# Patient Record
Sex: Male | Born: 1985 | Hispanic: Yes | Marital: Married | Smoking: Never smoker
Health system: Southern US, Community
[De-identification: ages and names within clinical notes are randomized; demographics above are authoritative.]

## PROBLEM LIST (undated history)

## (undated) DIAGNOSIS — R945 Abnormal results of liver function studies: Secondary | ICD-10-CM

## (undated) DIAGNOSIS — K529 Noninfective gastroenteritis and colitis, unspecified: Secondary | ICD-10-CM

## (undated) DIAGNOSIS — R1013 Epigastric pain: Secondary | ICD-10-CM

## (undated) DIAGNOSIS — K760 Fatty (change of) liver, not elsewhere classified: Secondary | ICD-10-CM

## (undated) DIAGNOSIS — R110 Nausea: Secondary | ICD-10-CM

## (undated) DIAGNOSIS — R7989 Other specified abnormal findings of blood chemistry: Secondary | ICD-10-CM

## (undated) DIAGNOSIS — K3184 Gastroparesis: Secondary | ICD-10-CM

## (undated) DIAGNOSIS — T7840XA Allergy, unspecified, initial encounter: Secondary | ICD-10-CM

## (undated) DIAGNOSIS — G8929 Other chronic pain: Secondary | ICD-10-CM

## (undated) DIAGNOSIS — A048 Other specified bacterial intestinal infections: Secondary | ICD-10-CM

## (undated) HISTORY — DX: Gastroparesis: K31.84

## (undated) HISTORY — PX: NASAL SINUS SURGERY: SHX719

## (undated) HISTORY — DX: Other specified bacterial intestinal infections: A04.8

## (undated) HISTORY — DX: Allergy, unspecified, initial encounter: T78.40XA

---

## 2013-01-17 ENCOUNTER — Encounter (HOSPITAL_COMMUNITY): Payer: Self-pay

## 2013-01-17 ENCOUNTER — Emergency Department (HOSPITAL_COMMUNITY)
Admission: EM | Admit: 2013-01-17 | Discharge: 2013-01-17 | Disposition: A | Discharge status: Home / Self Care | Payer: Self-pay | Source: Home / Self Care | Attending: Family Medicine | Admitting: Family Medicine

## 2013-01-17 DIAGNOSIS — K047 Periapical abscess without sinus: Secondary | ICD-10-CM

## 2013-01-17 DIAGNOSIS — K029 Dental caries, unspecified: Secondary | ICD-10-CM

## 2013-01-17 DIAGNOSIS — K089 Disorder of teeth and supporting structures, unspecified: Secondary | ICD-10-CM

## 2013-01-17 DIAGNOSIS — Q181 Preauricular sinus and cyst: Secondary | ICD-10-CM

## 2013-01-17 DIAGNOSIS — K0889 Other specified disorders of teeth and supporting structures: Secondary | ICD-10-CM

## 2013-01-17 MED ORDER — HYDROCODONE-ACETAMINOPHEN 5-325 MG PO TABS
1.0000 | ORAL_TABLET | Freq: Four times a day (QID) | ORAL | Status: DC | PRN
Start: 2013-01-17 — End: 2013-10-26

## 2013-01-17 MED ORDER — DOXYCYCLINE HYCLATE 100 MG PO TABS: 100.0000 mg | ORAL_TABLET | Freq: Two times a day (BID) | ORAL | Status: DC

## 2013-01-17 NOTE — ED Provider Notes (Signed)
History     CSN: [id number]  Arrival date & time 01/17/13  1226   First MD Initiated Contact with Patient 01/17/13 1228      Chief Complaint  Patient presents with  . Dental Pain    (Consider location/radiation/quality/duration/timing/severity/associated sxs/prior treatment) HPI Patient reports that he is continuing to have pain in the mouth.  He has an area in one of his molars where it has been very painful.  He reports that eating has been very painful for him.  He reports that he is having significant pain.  He reports that he would like to have a dentist.  He has immigrated to this country from [place].  He reports that he has had poor dental care for most of his life.  The patient reports that he's had a lot of dental problems in the past.  He would like to have a referral for a dentist to see him here.  The patient denies fever chills nausea vomiting diarrhea and rash.  History reviewed. No pertinent past medical history.  History reviewed. No pertinent past surgical history.  No family history on file.  History  Substance Use Topics  . Smoking status: Not on file  . Smokeless tobacco: Not on file  . Alcohol Use: Not on file    Review of Systems  HENT: Positive for dental problem.        Significant pain in the mouth and teeth  All other systems reviewed and are negative.    Allergies  Penicillins  Home Medications  No current outpatient prescriptions on file.  BP 152/87  Pulse 89  Temp 97.9 F (36.6 C) (Oral)  Resp 18  SpO2 100%  Physical Exam  Nursing note and vitals reviewed. Constitutional: He is oriented to person, place, and time. He appears well-developed and well-nourished. No distress.  HENT:  Head: Normocephalic and atraumatic.  Left Ear: External ear normal.  Nose: Nose normal.  Mouth/Throat: He does not have dentures. No oral lesions. Abnormal dentition. Dental abscesses and dental caries present. No uvula swelling or lacerations. No  oropharyngeal exudate, posterior oropharyngeal edema or tonsillar abscesses.         Patient has a cyst that is movable and smooth on the external right ear approximately 4 cm in diameter  Eyes: Conjunctivae normal and EOM are normal. Pupils are equal, round, and reactive to light.  Neck: Normal range of motion. Neck supple.  Cardiovascular: Normal rate, regular rhythm and normal heart sounds.   Pulmonary/Chest: Effort normal.  Abdominal: Soft.  Musculoskeletal: Normal range of motion.  Neurological: He is alert and oriented to person, place, and time.  Skin: Skin is warm and dry.  Psychiatric: He has a normal mood and affect. His behavior is normal. Thought content normal.    ED Course  Procedures (including critical care time)  Labs Reviewed - No data to display No results found.  No diagnosis found.  MDM  IMPRESSION  Dental Pain  Dental Abscess  Cyst right ear   Dental caries  RECOMMENDATIONS / PLAN Doxycycline 100 mg po BID Hydrocodone APAP 5/325 - take 1 po every 6 hours prn pain Dental Referral made today to [facility] In terms of the cyst on the right ear, the patient reports that is not causing any significant symptoms.  I told him that if he develops any changes in the cyst in terms of it getting larger or painful then I can send him to an ENT specialist surgeon to  have it removed.  The patient verbalized understanding.  FOLLOW UP 1 month   The patient was given clear instructions to go to ER or return to medical center if symptoms don't improve, worsen or new problems develop.  The patient verbalized understanding.  The patient was told to call to get lab results if they haven't heard anything in the next week.            Cleora Fleet, MD 01/17/13 1513

## 2013-01-17 NOTE — ED Notes (Signed)
Complain of pain to bottom left side of mouth for past 3 days

## 2013-02-14 ENCOUNTER — Emergency Department (INDEPENDENT_AMBULATORY_CARE_PROVIDER_SITE_OTHER)
Admission: EM | Admit: 2013-02-14 | Discharge: 2013-02-14 | Disposition: A | Discharge status: Home / Self Care | Payer: Self-pay | Source: Home / Self Care

## 2013-02-14 ENCOUNTER — Encounter (HOSPITAL_COMMUNITY): Payer: Self-pay

## 2013-02-14 DIAGNOSIS — K047 Periapical abscess without sinus: Secondary | ICD-10-CM

## 2013-02-14 MED ORDER — CLINDAMYCIN HCL 300 MG PO CAPS: 300.0000 mg | ORAL_CAPSULE | Freq: Four times a day (QID) | ORAL | Status: AC

## 2013-02-14 MED ORDER — ACETAMINOPHEN-CODEINE 300-60 MG PO TABS: 1.0000 | ORAL_TABLET | ORAL | Status: DC | PRN

## 2013-02-14 NOTE — ED Notes (Signed)
Patient complains of pain in tooth on left side Referral was faxed to [facility]  Waiting on an appt.

## 2013-02-14 NOTE — ED Provider Notes (Signed)
History     CSN: [id number]  Arrival date & time 02/14/13  5419  27 year old presents with continuing tooth pain. He was seen by Dr. Laural Benes on 01/17/13 and was prescribed doxycycline as well as Vicodin for pain he was supposed to followup with a dentist and has been waiting on his appointment. He denies any fever denies any facial pain however he does describe some pain which is worse at night. He has been using a mouthwash. He was provided a referral to see a dentist during his last appointment.    Chief Complaint  Patient presents with  . Dental Pain    (Consider location/radiation/quality/duration/timing/severity/associated sxs/prior treatment) HPI  History reviewed. No pertinent past medical history.  History reviewed. No pertinent past surgical history.  No family history on file.  History  Substance Use Topics  . Smoking status: Not on file  . Smokeless tobacco: Not on file  . Alcohol Use: Not on file      Review of Systems HENT: Positive for dental problem.  Significant pain in the mouth and teeth   Allergies  Penicillins  Home Medications   Current Outpatient Rx  Name  Route  Sig  Dispense  Refill  . acetaminophen-codeine (TYLENOL/CODEINE #4) 300-60 MG per tablet   Oral   Take 1 tablet by mouth every 4 (four) hours as needed for pain.   15 tablet   0   . clindamycin (CLEOCIN) 300 MG capsule   Oral   Take 1 capsule (300 mg total) by mouth 4 (four) times daily.   28 capsule   0   . doxycycline (VIBRA-TABS) 100 MG tablet   Oral   Take 1 tablet (100 mg total) by mouth 2 (two) times daily. Take with food.  Avoid sun exposure   20 tablet   0   . HYDROcodone-acetaminophen (NORCO/VICODIN) 5-325 MG per tablet   Oral   Take 1 tablet by mouth every 6 (six) hours as needed for pain.   30 tablet   0     Temp(Src) 98.2 F (36.8 C) (Oral)  Physical Exam Nursing note and vitals reviewed.  Constitutional: He is oriented to person, place, and time. He  appears well-developed and well-nourished. No distress.  HENT:  Head: Normocephalic and atraumatic.  Left Ear: External ear normal.  Nose: Nose normal.  Mouth/Throat: He does not have dentures. No oral lesions. Abnormal dentition. Dental abscesses and dental caries present. No uvula swelling or lacerations. No oropharyngeal exudate, posterior oropharyngeal edema or tonsillar abscesses.    Patient has a cyst that is movable and smooth on the external right ear approximately 4 cm in diameter  Eyes: Conjunctivae normal and EOM are normal. Pupils are equal, round, and reactive to light.  Neck: Normal range of motion. Neck supple.  Cardiovascular: Normal rate, regular rhythm and normal heart sounds.  Pulmonary/Chest: Effort normal.  Abdominal: Soft.  Musculoskeletal: Normal range of motion.  Neurological: He is alert and oriented to person, place, and time.  Skin: Skin is warm and dry.  Psychiatric: He has a normal mood and affect. His behavior is normal. Thought content normal.   ED Course  Procedures (including critical care time)  Labs Reviewed - No data to display No results found.   1. Dental abscess       MDM  Dental Pain  Dental Abscess  Cyst right ear  Dental caries    plan #1 patient has been provided with a prescription for clindamycin for another 7 days #2  he has been provided with a prescription for Tylenol with Codeine #3 dental referral was made on 02/04 at [facility] patient still waiting on appointment  Followup when necessary, return to ER if the pain worsens this was explained to the patient and he verbalized understanding      Richarda Overlie, MD 02/14/13 1557

## 2013-08-23 ENCOUNTER — Emergency Department (INDEPENDENT_AMBULATORY_CARE_PROVIDER_SITE_OTHER)
Admission: EM | Admit: 2013-08-23 | Discharge: 2013-08-23 | Disposition: A | Discharge status: Home / Self Care | Payer: Self-pay | Source: Home / Self Care | Attending: Emergency Medicine | Admitting: Emergency Medicine

## 2013-08-23 ENCOUNTER — Encounter (HOSPITAL_COMMUNITY): Payer: Self-pay | Admitting: *Deleted

## 2013-08-23 DIAGNOSIS — L723 Sebaceous cyst: Secondary | ICD-10-CM

## 2013-08-23 DIAGNOSIS — L089 Local infection of the skin and subcutaneous tissue, unspecified: Secondary | ICD-10-CM

## 2013-08-23 MED ORDER — HYDROCODONE-ACETAMINOPHEN 5-325 MG PO TABS: ORAL_TABLET | ORAL | Status: DC

## 2013-08-23 MED ORDER — DOXYCYCLINE HYCLATE 100 MG PO TABS: 100.0000 mg | ORAL_TABLET | Freq: Two times a day (BID) | ORAL | Status: DC

## 2013-08-23 NOTE — ED Notes (Signed)
Pt  Has  A  Large  Tender  Red  Mass  On r  Side  Face below  The  Earlobe    -  He  Has  Had  The  abcess  For a  While  But  It is  Worse  Lately

## 2013-08-23 NOTE — ED Provider Notes (Signed)
Chief Complaint:   Chief Complaint  Patient presents with  . Recurrent Skin Infections    History of Present Illness:    [name] Christensen is a 27 year old [place] male who has had a one-week history of a swollen, tender abscess just in front of his right ear. The patient says he's had a small cyst in that area for years. It's not draining any pus and he has not had a fever.  Review of Systems:  Other than noted above, the patient denies any of the following symptoms: Systemic:  No fever, chills or sweats. Skin:  No rash or itching.  PMFSH:  Past medical history, family history, social history, meds, and allergies were reviewed.  No history of diabetes or prior history of abscesses or MRSA.   Physical Exam:   Vital signs:  BP 132/66  Pulse 52  Temp(Src) 98 F (36.7 C) (Oral)  Resp 16  SpO2 100% Skin:  There was a large fluctuant mass just in front of his right ear without any drainage.  Skin exam was otherwise normal.  No rash. Ext:  Distal pulses were full, patient has full ROM of all joints.  Procedure:  Verbal informed consent was obtained.  The patient was informed of the risks and benefits of the procedure and understands and accepts.  Identity of the patient was verified verbally and by wristband.   The abscess area described above was prepped with Betadine and alcohol and anesthetized with 5 mL of 2% Xylocaine with epinephrine.  Using a #11 scalpel blade, a singe straight incision was made into the area of fluctulence, yielding a extremely large amount of prurulent drainage and malodorous squamous debris.  Routine cultures were obtained.  Blunt dissection was used to break up loculations, the cyst wall was grasped and removed in a single piece, and the resulting wound cavity was packed with 1/4 inch Iodoform gauze.  A sterile pressure dressing was applied.  Assessment:  The encounter diagnosis was Infected sebaceous cyst.  I think I have removed the entire cyst. I told him that if it  does recur I would suggest referral to an ENT doctor.  Plan:   1.  Meds:  The following meds were prescribed:   Discharge Medication List as of 08/23/2013 11:50 AM    START taking these medications   Details  !! doxycycline (VIBRA-TABS) 100 MG tablet Take 1 tablet (100 mg total) by mouth 2 (two) times daily., Starting 08/23/2013, Until Discontinued, Normal    !! HYDROcodone-acetaminophen (NORCO/VICODIN) 5-325 MG per tablet 1 to 2 tabs every 4 to 6 hours as needed for pain., Print     !! - Potential duplicate medications found. Please discuss with provider.      2.  Patient Education/Counseling:  The patient was given appropriate handouts, self care instructions, and instructed in symptomatic relief.  Should leave the dressing in place.  3.  Follow up:  The patient was instructed to leave the dressing in place and return again in 48 hours for packing removal, if becoming worse in any way, and given some red flag symptoms such as fever or increasing pain which would prompt immediate return.  Follow up here in 48 hours.     Reuben Likes, MD 08/23/13 1434

## 2013-08-23 NOTE — ED Notes (Signed)
abcess

## 2013-08-25 ENCOUNTER — Encounter (HOSPITAL_COMMUNITY): Payer: Self-pay | Admitting: Emergency Medicine

## 2013-08-25 ENCOUNTER — Emergency Department (INDEPENDENT_AMBULATORY_CARE_PROVIDER_SITE_OTHER)
Admission: EM | Admit: 2013-08-25 | Discharge: 2013-08-25 | Disposition: A | Discharge status: Home / Self Care | Payer: No Typology Code available for payment source | Source: Home / Self Care | Attending: Family Medicine | Admitting: Family Medicine

## 2013-08-25 DIAGNOSIS — L089 Local infection of the skin and subcutaneous tissue, unspecified: Secondary | ICD-10-CM

## 2013-08-25 NOTE — ED Notes (Signed)
Pt here for wound check of right side of face. Possible packing removal. Denies fever and pain. Pt voices no concerns at this time.

## 2013-08-25 NOTE — ED Provider Notes (Signed)
CSN: [id number]     Arrival date & time 08/25/13  1012 History   First MD Initiated Contact with Patient 08/25/13 1035     Chief Complaint  Patient presents with  . Wound Check    possible packing removal   (Consider location/radiation/quality/duration/timing/severity/associated sxs/prior Treatment) HPI Comments: 27 year old male presents for wound check. He had abscess and sebaceous cyst removal from in front of his right ear done here 2 days ago. It has been draining a small amount of blood. The pain is significantly decreased. No fever, chills, NVD. No pain elsewhere.   History reviewed. No pertinent past medical history. History reviewed. No pertinent past surgical history. History reviewed. No pertinent family history. History  Substance Use Topics  . Smoking status: Never Smoker   . Smokeless tobacco: Not on file  . Alcohol Use: No    Review of Systems  Constitutional: Negative for fever, chills and fatigue.  HENT: Negative for sore throat, neck pain and neck stiffness.   Eyes: Negative for visual disturbance.  Respiratory: Negative for cough and shortness of breath.   Cardiovascular: Negative for chest pain, palpitations and leg swelling.  Gastrointestinal: Negative for nausea, vomiting, abdominal pain, diarrhea and constipation.  Genitourinary: Negative for dysuria, urgency, frequency and hematuria.  Musculoskeletal: Negative for myalgias and arthralgias.  Skin: Positive for wound. Negative for rash.  Neurological: Negative for dizziness, weakness and light-headedness.    Allergies  Penicillins  Home Medications   Current Outpatient Rx  Name  Route  Sig  Dispense  Refill  . doxycycline (VIBRA-TABS) 100 MG tablet   Oral   Take 1 tablet (100 mg total) by mouth 2 (two) times daily. Take with food.  Avoid sun exposure   20 tablet   0   . doxycycline (VIBRA-TABS) 100 MG tablet   Oral   Take 1 tablet (100 mg total) by mouth 2 (two) times daily.   20 tablet   0   . HYDROcodone-acetaminophen (NORCO/VICODIN) 5-325 MG per tablet   Oral   Take 1 tablet by mouth every 6 (six) hours as needed for pain.   30 tablet   0   . acetaminophen-codeine (TYLENOL/CODEINE #4) 300-60 MG per tablet   Oral   Take 1 tablet by mouth every 4 (four) hours as needed for pain.   15 tablet   0   . HYDROcodone-acetaminophen (NORCO/VICODIN) 5-325 MG per tablet      1 to 2 tabs every 4 to 6 hours as needed for pain.   20 tablet   0    BP 143/85  Pulse 95  Temp(Src) 98.7 F (37.1 C) (Oral)  Resp 16  SpO2 99% Physical Exam  Nursing note and vitals reviewed. Constitutional: He is oriented to person, place, and time. He appears well-developed and well-nourished. No distress.  HENT:  Head:    Neurological: He is alert and oriented to person, place, and time. Coordination normal.  Skin: Skin is warm and dry. No rash noted. He is not diaphoretic.  Psychiatric: He has a normal mood and affect. Judgment normal.    ED Course  Procedures (including critical care time) Labs Review Labs Reviewed - No data to display Imaging Review No results found.  MDM   1. Infected sebaceous cyst    Per previous note, Dr. Lorenz Coaster believes he removed the entire cyst. The drainage at this time is bloody, not purulent, and patient has significant improvements I believe all of the cyst was in fact removed. Packing removed, We'll  continue to treat with warm compresses and antibiotic ointment dressings. Followup if any worsening     Graylon Good, PA-C 08/25/13 1130

## 2013-08-26 LAB — CULTURE, ROUTINE-ABSCESS: Gram Stain: NONE SEEN

## 2013-08-26 NOTE — ED Provider Notes (Signed)
Medical screening examination/treatment/procedure(s) were performed by non-physician practitioner and as supervising physician I was immediately available for consultation/collaboration.   [facility]; MD  Sharin Grave, MD 08/26/13 [phone]

## 2013-08-26 NOTE — ED Notes (Signed)
Abscess culture: Few Klebsiella Oxytoca. Pt. treated with I and D with cyst removal and  Doxycycline- not on sensitivity report. Message sent to Dr. Lorenz Coaster. [name] 08/26/2013

## 2013-08-27 ENCOUNTER — Telehealth (HOSPITAL_COMMUNITY): Payer: Self-pay | Admitting: *Deleted

## 2013-08-27 NOTE — ED Notes (Signed)
Discussed with Dr. Denyse Amass.  He said to call for clinical improvement. I called but VM is not set up.  Will try again later. [name] 08/27/2013

## 2013-08-29 NOTE — ED Notes (Signed)
Dr. Lorenz Coaster reviewed labs and wrote to me, "since its improved, no need for any change in treatment.  No further action needed. [name] 08/29/2013

## 2013-09-20 ENCOUNTER — Encounter (HOSPITAL_COMMUNITY): Payer: Self-pay | Admitting: Emergency Medicine

## 2013-09-20 ENCOUNTER — Emergency Department (INDEPENDENT_AMBULATORY_CARE_PROVIDER_SITE_OTHER)
Admission: EM | Admit: 2013-09-20 | Discharge: 2013-09-20 | Disposition: A | Discharge status: Home / Self Care | Payer: Self-pay | Source: Home / Self Care | Attending: Emergency Medicine | Admitting: Emergency Medicine

## 2013-09-20 DIAGNOSIS — A048 Other specified bacterial intestinal infections: Secondary | ICD-10-CM

## 2013-09-20 MED ORDER — OMEPRAZOLE 20 MG PO CPDR: 20.0000 mg | DELAYED_RELEASE_CAPSULE | Freq: Two times a day (BID) | ORAL | Status: DC

## 2013-09-20 MED ORDER — DOXYCYCLINE HYCLATE 100 MG PO TABS: 100.0000 mg | ORAL_TABLET | Freq: Two times a day (BID) | ORAL | Status: DC

## 2013-09-20 MED ORDER — METRONIDAZOLE 500 MG PO TABS: 500.0000 mg | ORAL_TABLET | Freq: Two times a day (BID) | ORAL | Status: DC

## 2013-09-20 NOTE — ED Notes (Signed)
Pt  Reports    History  Of  H  pylora       In past   He now  Reports         abd  Pain     For  About 1  Month   Symptoms  Not totally  releived by otc  prilosec         No  Severe  Distress

## 2013-09-20 NOTE — ED Provider Notes (Signed)
Chief Complaint:   Chief Complaint  Patient presents with  . Abdominal Pain    History of Present Illness:    [name] Christensen is a 27 year old male who came here from [place] 5 years ago. He presents today with a one-week history of epigastric pain. This occurs only after eating meals, and it doesn't seem to matter what he eats, anything can bring the discomfort. It feels like a hunger pain. It's associated with some nausea, especially first thing in the morning when he brushes his teeth. His had some retching but never vomited any vomitus. The pain is intermittently better with Prilosec but seems to always come back again. He was treated in [place] 5 years ago for Helicobacter pylori with some pills. He's not sure exactly what he took. He had done well up until the past week. He denies any history of ulcer. He did have endoscopy in [place] which showed gastritis. No history of GI bleeding, melena, or hematochezia. He denies any use of alcohol, tobacco, recreational drugs, excessive caffeine, or excessive anti-inflammatories.  Review of Systems:  Other than noted above, the patient denies any of the following symptoms: Constitutional:  No fever, chills, fatigue, weight loss or anorexia. Lungs:  No cough or shortness of breath. Heart:  No chest pain, palpitations, syncope or edema.  No cardiac history. Abdomen:  No nausea, vomiting, hematememesis, melena, diarrhea, or hematochezia. GU:  No dysuria, frequency, urgency, or hematuria.  No testicular pain or swelling.  PMFSH:  Past medical history, family history, social history, meds, and allergies were reviewed along with nurse's notes.  No prior abdominal surgeries or history of GI problems.  No use of NSAIDs or aspirin.  No excessive  alcohol intake.  Physical Exam:   Vital signs:  BP 146/86  Pulse 84  Temp(Src) 98.2 F (36.8 C) (Oral)  Resp 20  SpO2 100% Gen:  Alert, oriented, in no distress. Lungs:  Breath sounds clear and equal bilaterally.  No  wheezes, rales or rhonchi. Heart:  Regular rhythm.  No gallops or murmers.   Abdomen:  Soft, flat, nondistended. No organomegaly or mass. He has mild epigastric tenderness to palpation without guarding or rebound. Bowel sounds are normally active. Skin:  Clear, warm and dry.  No rash.  Assessment:  The encounter diagnosis was H. pylori infection.  It appears that he is been treated once and this has recurred.  Plan:   1.  Meds:  The following meds were prescribed:   Discharge Medication List as of 09/20/2013 11:26 AM    START taking these medications   Details  !! doxycycline (VIBRA-TABS) 100 MG tablet Take 1 tablet (100 mg total) by mouth 2 (two) times daily., Starting 09/20/2013, Until Discontinued, Normal    metroNIDAZOLE (FLAGYL) 500 MG tablet Take 1 tablet (500 mg total) by mouth 2 (two) times daily., Starting 09/20/2013, Until Discontinued, Normal    !! omeprazole (PRILOSEC) 20 MG capsule Take 1 capsule (20 mg total) by mouth 2 (two) times daily before a meal., Starting 09/20/2013, Until Discontinued, Normal     !! - Potential duplicate medications found. Please discuss with provider.     The patient was also told to take Pepto-Bismol, 2 tablespoons full 4 times daily with meals and at bedtime for the next 2 weeks. We'll plan on a two-week regimen of treatment and followup with gastroenterology thereafter.  2.  Patient Education/Counseling:  The patient was given appropriate handouts, self care instructions, and instructed in symptomatic relief.  Patient will avoid  spicy and acidic foods.  3.  Follow up:  The patient was told to follow up if no better in 3 to 4 days, if becoming worse in any way, and given some red flag symptoms such as any sign of GI bleeding or worsening of the pain which would prompt immediate return.  [facility] up [facility] to establish with a primary care physician there and get an orange card. Thereafter will need to be referred to a  gastroenterologist. Was given the name of Dr. Erick Blinks to see in about 2-3 weeks.      Reuben Likes, MD 09/20/13 1159

## 2013-10-09 ENCOUNTER — Encounter: Payer: Self-pay | Admitting: Internal Medicine

## 2013-10-14 DIAGNOSIS — A048 Other specified bacterial intestinal infections: Secondary | ICD-10-CM

## 2013-10-14 HISTORY — DX: Other specified bacterial intestinal infections: A04.8

## 2013-10-26 ENCOUNTER — Encounter: Payer: Self-pay | Admitting: Internal Medicine

## 2013-10-26 ENCOUNTER — Ambulatory Visit: Payer: No Typology Code available for payment source | Attending: Internal Medicine | Admitting: Internal Medicine

## 2013-10-26 ENCOUNTER — Ambulatory Visit (HOSPITAL_COMMUNITY)
Admission: RE | Admit: 2013-10-26 | Discharge: 2013-10-26 | Disposition: A | Discharge status: Home / Self Care | Payer: No Typology Code available for payment source | Source: Ambulatory Visit | Attending: Internal Medicine | Admitting: Internal Medicine

## 2013-10-26 ENCOUNTER — Telehealth: Payer: Self-pay | Admitting: Emergency Medicine

## 2013-10-26 VITALS — BP 148/89 | HR 99 | Temp 98.7°F | Resp 13

## 2013-10-26 DIAGNOSIS — R6884 Jaw pain: Secondary | ICD-10-CM | POA: Insufficient documentation

## 2013-10-26 DIAGNOSIS — K0889 Other specified disorders of teeth and supporting structures: Secondary | ICD-10-CM | POA: Insufficient documentation

## 2013-10-26 DIAGNOSIS — K089 Disorder of teeth and supporting structures, unspecified: Secondary | ICD-10-CM | POA: Insufficient documentation

## 2013-10-26 MED ORDER — HYDROCODONE-ACETAMINOPHEN 5-325 MG PO TABS: 1.0000 | ORAL_TABLET | Freq: Four times a day (QID) | ORAL | Status: DC | PRN

## 2013-10-26 MED ORDER — CLINDAMYCIN HCL 300 MG PO CAPS: 300.0000 mg | ORAL_CAPSULE | Freq: Three times a day (TID) | ORAL | Status: DC

## 2013-10-26 NOTE — Progress Notes (Unsigned)
Pt here to establish care  No medical problems noted Hx H pylori- completed atb course Taking prescribed Doxycycline/hydrocodone tooth abscess Need to finish elgibility Denies n/v/d

## 2013-10-26 NOTE — Progress Notes (Unsigned)
Patient ID: [name] Christensen, male   DOB: March 24, 1986, 27 y.o.   MRN: [id number]  Patient Demographics  [name], is a 27 y.o. male  CSN: [id number]  MRN: [id number]  DOB - [birth year]  Outpatient Primary MD for the patient is No PCP Per Patient   With History of -  History reviewed. No pertinent past medical history.    History reviewed. No pertinent past surgical history.  in for   Chief Complaint  Patient presents with  . Establish Care  . Abdominal Pain     HPI  [name] Christensen  is a 27 y.o. male, no known medical problems except left lower jaw dental pain for which he seek help few weeks ago, he is here to establish care, he still has dull constant nonradiating left lower jaw dental pain which is worse with eating food better with rest, he does have sensitivity to heat and cold, denies fever chills, denies headache, he did go to a dentist however he could not be seen due to lack of insurance.  He has no subjective complaints.    Review of Systems    In addition to the HPI above,  No Fever-chills, No Headache, No changes with Vision or hearing, No problems swallowing food or Liquids, does have left lower jaw dental pain No Chest pain, Cough or Shortness of Breath, No Abdominal pain, No Nausea or Vommitting, Bowel movements are regular, No Blood in stool or Urine, No dysuria, No new skin rashes or bruises, No new joints pains-aches,  No new weakness, tingling, numbness in any extremity, No recent weight gain or loss, No polyuria, polydypsia or polyphagia, No significant Mental Stressors.  A full 10 point Review of Systems was done, except as stated above, all other Review of Systems were negative.   Social History History  Substance Use Topics  . Smoking status: Never Smoker   . Smokeless tobacco: Not on file  . Alcohol Use: No      Family History DM +ve in mother  Prior to Admission medications   Medication Sig Start Date End Date Taking? Authorizing  Provider  omeprazole (PRILOSEC) 20 MG capsule Take 1 capsule (20 mg total) by mouth 2 (two) times daily before a meal. 09/20/13  Yes Reuben Likes, MD  clindamycin (CLEOCIN) 300 MG capsule Take 1 capsule (300 mg total) by mouth 3 (three) times daily. 10/26/13   Leroy Sea, MD  HYDROcodone-acetaminophen (NORCO/VICODIN) 5-325 MG per tablet Take 1 tablet by mouth every 6 (six) hours as needed. 10/26/13   Leroy Sea, MD    Allergies  Allergen Reactions  . Penicillins     Physical Exam  Vitals  Blood pressure 148/89, pulse 99, temperature 98.7 F (37.1 C), resp. rate 13, SpO2 100.00%.   1. General young Hispanic amlesitting on clinic examination table in no apparent distress,     2. Normal affect and insight, Not Suicidal or Homicidal, Awake Alert, Oriented X 3.  3. No F.N deficits, ALL C.Nerves Intact, Strength 5/5 all 4 extremities, Sensation intact all 4 extremities, Plantars down going.  4. Ears and Eyes appear Normal, Conjunctivae clear, PERRLA. Moist Oral Mucosa. Poor oral dentition, multiple teeth have advanced care is, no obvious signs of dental abscess  5. Supple Neck, No JVD, No cervical lymphadenopathy appriciated, No Carotid Bruits.  6. Symmetrical Chest wall movement, Good air movement bilaterally, CTAB.  7. RRR, No Gallops, Rubs or Murmurs, No Parasternal Heave.  8. Positive Bowel Sounds, Abdomen Soft, Non tender,  No organomegaly appriciated,No rebound -guarding or rigidity.  9.  No Cyanosis, Normal Skin Turgor, No Skin Rash or Bruise.  10. Good muscle tone,  joints appear normal , no effusions, Normal ROM.  11. No Palpable Lymph Nodes in Neck or Axillae     Data Review  No results found for this basename: WBC, HGB, HCT, MCV, PLT      Chemistry   No results found for this basename: NA, K, CL, CO2, BUN, CREATININE, GLU   No results found for this basename: CALCIUM, ALKPHOS, AST, ALT, BILITOT       No results found for this basename: HGBA1C     No results found for this basename: CHOL, HDL, LDLCALC, LDLDIRECT, TRIG, CHOLHDL    No results found for this basename: TSH    No results found for this basename: PSA         Assessment and plan  Recurrent left lower jaw dental pain and discomfort with associated care is. No evidence of abscess on exam, will place him on clindamycin along with pain control, social work referral made to get urgent orange colored, dental referral made again. Will obtain of Orthopentogram in the meantime.    Routine health maintenance.  Flu shot given   TD ordered    Leroy Sea M.D on 10/26/2013 at 10:04 AM

## 2013-10-26 NOTE — Telephone Encounter (Signed)
Pt called to pick scripts at [facility]

## 2013-10-30 ENCOUNTER — Ambulatory Visit: Payer: Self-pay

## 2013-10-31 ENCOUNTER — Encounter: Payer: Self-pay | Admitting: Internal Medicine

## 2013-11-03 ENCOUNTER — Encounter: Payer: Self-pay | Admitting: Internal Medicine

## 2013-11-03 ENCOUNTER — Ambulatory Visit (INDEPENDENT_AMBULATORY_CARE_PROVIDER_SITE_OTHER): Payer: No Typology Code available for payment source | Admitting: Internal Medicine

## 2013-11-03 VITALS — BP 110/72 | HR 97 | Ht 66.0 in | Wt 209.0 lb

## 2013-11-03 DIAGNOSIS — K219 Gastro-esophageal reflux disease without esophagitis: Secondary | ICD-10-CM

## 2013-11-03 DIAGNOSIS — K3189 Other diseases of stomach and duodenum: Secondary | ICD-10-CM

## 2013-11-03 DIAGNOSIS — Z8619 Personal history of other infectious and parasitic diseases: Secondary | ICD-10-CM

## 2013-11-03 DIAGNOSIS — R1013 Epigastric pain: Secondary | ICD-10-CM

## 2013-11-03 MED ORDER — OMEPRAZOLE 20 MG PO CPDR: 20.0000 mg | DELAYED_RELEASE_CAPSULE | Freq: Two times a day (BID) | ORAL | Status: DC

## 2013-11-03 MED ORDER — OMEPRAZOLE 40 MG PO CPDR: 40.0000 mg | DELAYED_RELEASE_CAPSULE | Freq: Every day | ORAL | Status: DC

## 2013-11-03 NOTE — Patient Instructions (Signed)
Your physician has requested that you go to the basement for the following lab work before leaving today: H pylori   We have sent the following medications to your pharmacy for you to pick up at your convenience: after you complete H pylori stool antigen start omeprazol daily. With an orange card you should pick up your prescription at the [facility] out patient pharmacy                                               We are excited to introduce MyChart, a new best-in-class service that provides you online access to important information in your electronic medical record. We want to make it easier for you to view your health information - all in one secure location - when and where you need it. We expect MyChart will enhance the quality of care and service we provide.  When you register for MyChart, you can:    View your test results.    Request appointments and receive appointment reminders via email.    Request medication renewals.    View your medical history, allergies, medications and immunizations.    Communicate with your physician's office through a password-protected site.    Conveniently print information such as your medication lists.  To find out if MyChart is right for you, please talk to a member of our clinical staff today. We will gladly answer your questions about this free health and wellness tool.  If you are age 28 or older and want a member of your family to have access to your record, you must provide written consent by completing a proxy form available at our office. Please speak to our clinical staff about guidelines regarding accounts for patients younger than age 6.  As you activate your MyChart account and need any technical assistance, please call the MyChart technical support line at [phone]-[phone]) or email your question to [email or web address]. If you email your question(s), please include your name, a return phone number and the best time  to reach you.  If you have non-urgent health-related questions, you can send a message to our office through MyChart at [email or web address]. If you have a medical emergency, call 911.  Thank you for using MyChart as your new health and wellness resource!   MyChart licensed from [employer],  [id number]. Patents Pending.

## 2013-11-03 NOTE — Progress Notes (Signed)
Patient ID: [name] Christensen, male   DOB: Jan 09, 1986, 27 y.o.   MRN: [id number] HPI: [name] Christensen is a 27 yo male with PMH of H. Pylori gastritis and recent dental infection treated with clindamycin who is seen to evaluate nausea, decreased appetite and an "empty feeling" in his stomach.  Pt is here today  with a Spanish interpreter. He reports an off-and-on history of "stomach problems" dating back to 2010 when he had an upper endoscopy performed in [place]. He reports he was diagnosed with Helicobacter pylori gastritis. He has a sheet of paper with him indicating he was treated with metronidazole.  He reports symptoms did seem to improve but over the last several weeks to months he has been having decreased appetite as well as a "empty feeling" in his stomach. This is despite eating.  He does have some mild nausea but no vomiting. He has not lost weight in fact he has gained weight. He does report occasional heartburn with regurgitation. Bowels are regular with no melena or rectal bleeding. He denies abdominal pain. He did recently complete a course of clindamycin for a dental abscess. He is not taking PPI at present.  Past Medical History  Diagnosis Date  . H. pylori infection     Past Surgical History  Procedure Laterality Date  . Nasal sinus surgery      Current Outpatient Prescriptions  Medication Sig Dispense Refill  . omeprazole (PRILOSEC) 40 MG capsule Take 1 capsule (40 mg total) by mouth daily.  90 capsule  3   No current facility-administered medications for this visit.    Allergies  Allergen Reactions  . Penicillins     Family History  Problem Relation Age of Onset  . Breast cancer      great aunt  . Diabetes Paternal Uncle   . Diabetes Maternal Grandmother   . Kidney disease Mother     History  Substance Use Topics  . Smoking status: Never Smoker   . Smokeless tobacco: Never Used  . Alcohol Use: No    ROS: As per history of present illness, otherwise negative  BP  110/72  Pulse 97  Ht 5\' 6"  (1.676 m)  Wt 209 lb (94.802 kg)  BMI 33.75 kg/m2 Constitutional: Well-developed and well-nourished. No distress. HEENT: Normocephalic and atraumatic. Oropharynx is clear and moist. No oropharyngeal exudate. Conjunctivae are normal.  No scleral icterus. Neck: Neck supple. Trachea midline. Cardiovascular: Normal rate, regular rhythm and intact distal pulses. No M/R/G Pulmonary/chest: Effort normal and breath sounds normal. No wheezing, rales or rhonchi. Abdominal: Soft, nontender, nondistended. Bowel sounds active throughout. There are no masses palpable. No hepatosplenomegaly. Extremities: no clubbing, cyanosis, or edema Neurological: Alert and oriented to person place and time. Skin: Skin is warm and dry. No rashes noted. Psychiatric: Normal mood and affect. Behavior is normal.   ASSESSMENT/PLAN: 27 yo male with PMH of H. Pylori gastritis and recent dental infection treated with clindamycin who is seen to evaluate nausea, decreased appetite and an "empty feeling" in his stomach.  1.  Dyspepsia/GERD -- I would like to document H. pylori eradication. We will order an H. pylori stool antigen today. He is instructed to submit this test before resuming PPI. After submitting the stool test I would like him to resume omeprazole 40 mg daily. Hopefully this will help with his dyspeptic symptoms and heartburn. If his stool antigen is positive he will need retreatment. I will see him back in about 6-8 weeks to ensure he's improving. I asked that  he call should his symptoms worsen prior to followup he voices understanding.

## 2013-11-06 ENCOUNTER — Telehealth: Payer: Self-pay | Admitting: Internal Medicine

## 2013-11-06 ENCOUNTER — Other Ambulatory Visit: Payer: No Typology Code available for payment source

## 2013-11-06 DIAGNOSIS — R1013 Epigastric pain: Secondary | ICD-10-CM

## 2013-11-06 DIAGNOSIS — Z8619 Personal history of other infectious and parasitic diseases: Secondary | ICD-10-CM

## 2013-11-06 MED ORDER — OMEPRAZOLE 40 MG PO CPDR: 40.0000 mg | DELAYED_RELEASE_CAPSULE | Freq: Every day | ORAL | Status: DC

## 2013-11-06 NOTE — Telephone Encounter (Signed)
Rx sent to [facility] long outpatient pharm.

## 2013-11-07 ENCOUNTER — Telehealth: Payer: Self-pay | Admitting: *Deleted

## 2013-11-07 LAB — HELICOBACTER PYLORI  SPECIAL ANTIGEN: H. PYLORI Antigen: POSITIVE

## 2013-11-07 MED ORDER — OMEPRAZOLE 40 MG PO CPDR: DELAYED_RELEASE_CAPSULE | ORAL | Status: DC

## 2013-11-07 MED ORDER — BIS SUBCIT-METRONID-TETRACYC 140-125-125 MG PO CAPS: 3.0000 | ORAL_CAPSULE | Freq: Three times a day (TID) | ORAL | Status: DC

## 2013-11-07 NOTE — Telephone Encounter (Signed)
Message copied by Florene Glen on Tue Nov 07, 2013  1:08 PM ------      Message from: Beverley Fiedler      Created: Tue Nov 07, 2013  8:52 AM       Positive H Pylori Antigen indicating active infection      Please treat with Pylera + twice daily PPI       ------

## 2013-11-07 NOTE — Telephone Encounter (Signed)
Pt came in and with the help of Victorino Dike who helped with the translation. Pt given instructions for Pylera and he was given samples of Zegerid. He states he doesn't have $20 to buy the Omeprazole at [facility]. I will call [facility] tomorrow for hopefully assistance with a PPI.

## 2013-11-07 NOTE — Telephone Encounter (Signed)
Spoke with pt via [employer] [phone] and informed him of + for H. Pylori. We can give him samples of Pylera to use. He states he never got the Omeprazole that was called in. Called [facility] and the order has been put on hold; 90 capsules will cost $20.05. Pt is coming in . I have left a message at [facility] for someone to call me about assistance with meds .

## 2013-12-05 ENCOUNTER — Encounter (HOSPITAL_COMMUNITY): Payer: Self-pay | Admitting: Emergency Medicine

## 2013-12-05 ENCOUNTER — Emergency Department (HOSPITAL_COMMUNITY)
Admission: EM | Admit: 2013-12-05 | Discharge: 2013-12-05 | Disposition: A | Discharge status: Home / Self Care | Payer: No Typology Code available for payment source | Source: Home / Self Care | Attending: Family Medicine | Admitting: Family Medicine

## 2013-12-05 DIAGNOSIS — K0889 Other specified disorders of teeth and supporting structures: Secondary | ICD-10-CM

## 2013-12-05 DIAGNOSIS — K089 Disorder of teeth and supporting structures, unspecified: Secondary | ICD-10-CM

## 2013-12-05 MED ORDER — TRAMADOL HCL 50 MG PO TABS: 50.0000 mg | ORAL_TABLET | Freq: Four times a day (QID) | ORAL | Status: DC | PRN

## 2013-12-05 MED ORDER — CLINDAMYCIN HCL 150 MG PO CAPS: 150.0000 mg | ORAL_CAPSULE | Freq: Three times a day (TID) | ORAL | Status: DC

## 2013-12-05 NOTE — ED Notes (Signed)
Pt  Reports  Toothache      r  Lower    Side            X  1  Week       Pt  Has  An  appt  With a  Dentist  Next  Week     Pt  Reports  The  Pain     The pain is not  releived  By otc   meds

## 2013-12-05 NOTE — ED Provider Notes (Signed)
[name] Christensen is a 27 y.o. male who presents to Urgent Care today for right upper dental pain present for months worse over the past week. Patient at times has been on different antibiotics and pain medications. He has run out of all of his medications and currently is using over-the-counter medicines. He says he has a dental appointment scheduled for January 6th. No fevers chills nausea vomiting or diarrhea. And is moderate and worse with chewing.   Past Medical History  Diagnosis Date  . H. pylori infection    History  Substance Use Topics  . Smoking status: Never Smoker   . Smokeless tobacco: Never Used  . Alcohol Use: No   ROS as above Medications reviewed. No current facility-administered medications for this encounter.   Current Outpatient Prescriptions  Medication Sig Dispense Refill  . bismuth-metronidazole-tetracycline (PYLERA) 140-125-125 MG per capsule Take 3 capsules by mouth 4 (four) times daily -  before meals and at bedtime.  120 capsule  0  . clindamycin (CLEOCIN) 150 MG capsule Take 1 capsule (150 mg total) by mouth 3 (three) times daily.  30 capsule  0  . omeprazole (PRILOSEC) 40 MG capsule Take one capsule by mouth two times daily while on Pylera, then go back to once daily.  20 capsule  0  . traMADol (ULTRAM) 50 MG tablet Take 1 tablet (50 mg total) by mouth every 6 (six) hours as needed.  15 tablet  0    Exam:  BP 133/82  Pulse 85  Temp(Src) 98.9 F (37.2 C) (Oral)  Resp 16  SpO2 100% Gen: Well NAD HEENT: EOMI,  MMM poor dentition throughout with multiple dental caries. Right upper rear tooth is tender to touch No palpable neck masses   Assessment and Plan: 27 y.o. male with dental pain. Stressed the importance of following up with a dentist. Will treat with clindamycin as patient is penicillin allergic and use small amount of tramadol. Additionally I provided the contact information for 2 affordable dentists. Discussed warning signs or symptoms. Please see  discharge instructions. Patient expresses understanding.       Rodolph Bong, MD 12/05/13 [phone]

## 2013-12-26 ENCOUNTER — Encounter: Payer: Self-pay | Admitting: Internal Medicine

## 2013-12-26 ENCOUNTER — Ambulatory Visit: Payer: Self-pay

## 2013-12-27 ENCOUNTER — Ambulatory Visit (INDEPENDENT_AMBULATORY_CARE_PROVIDER_SITE_OTHER): Payer: No Typology Code available for payment source | Admitting: Internal Medicine

## 2013-12-27 ENCOUNTER — Encounter: Payer: Self-pay | Admitting: Internal Medicine

## 2013-12-27 ENCOUNTER — Encounter (HOSPITAL_COMMUNITY): Payer: Self-pay | Admitting: Emergency Medicine

## 2013-12-27 ENCOUNTER — Emergency Department (INDEPENDENT_AMBULATORY_CARE_PROVIDER_SITE_OTHER)
Admission: EM | Admit: 2013-12-27 | Discharge: 2013-12-27 | Disposition: A | Discharge status: Home / Self Care | Payer: No Typology Code available for payment source | Source: Home / Self Care | Attending: Emergency Medicine | Admitting: Emergency Medicine

## 2013-12-27 VITALS — BP 136/78 | HR 88 | Ht 66.0 in | Wt 204.0 lb

## 2013-12-27 DIAGNOSIS — K089 Disorder of teeth and supporting structures, unspecified: Secondary | ICD-10-CM

## 2013-12-27 DIAGNOSIS — R768 Other specified abnormal immunological findings in serum: Secondary | ICD-10-CM

## 2013-12-27 DIAGNOSIS — K0401 Reversible pulpitis: Secondary | ICD-10-CM

## 2013-12-27 DIAGNOSIS — R894 Abnormal immunological findings in specimens from other organs, systems and tissues: Secondary | ICD-10-CM

## 2013-12-27 DIAGNOSIS — K029 Dental caries, unspecified: Secondary | ICD-10-CM

## 2013-12-27 DIAGNOSIS — A048 Other specified bacterial intestinal infections: Secondary | ICD-10-CM | POA: Insufficient documentation

## 2013-12-27 DIAGNOSIS — K0889 Other specified disorders of teeth and supporting structures: Secondary | ICD-10-CM

## 2013-12-27 MED ORDER — MELOXICAM 15 MG PO TABS: 15.0000 mg | ORAL_TABLET | Freq: Every day | ORAL | Status: DC

## 2013-12-27 MED ORDER — HYDROCODONE-ACETAMINOPHEN 5-325 MG PO TABS
2.0000 | ORAL_TABLET | Freq: Once | ORAL | Status: AC
  Administered 2013-12-27: 2 via ORAL

## 2013-12-27 MED ORDER — CLINDAMYCIN HCL 300 MG PO CAPS: 300.0000 mg | ORAL_CAPSULE | Freq: Four times a day (QID) | ORAL | Status: DC

## 2013-12-27 MED ORDER — HYDROCODONE-ACETAMINOPHEN 5-325 MG PO TABS
ORAL_TABLET | ORAL | Status: AC
  Filled 2013-12-27: qty 2

## 2013-12-27 MED ORDER — OXYCODONE-ACETAMINOPHEN 5-325 MG PO TABS: ORAL_TABLET | ORAL | Status: DC

## 2013-12-27 NOTE — Telephone Encounter (Signed)
Several messages were left for [facility] and they never called back. Pt is in the office this morning and again, I only get recording when I try to reach someone. [facility] [employer] [facility] and had an appt there yesterday, but was at a dentist getting much needed work done. [facility] and [facility] to give pt another appt.

## 2013-12-27 NOTE — Discharge Instructions (Signed)
Look up the [employer] [facility] for free dental clinics. Http://www.ncdental.org/ncds/Schedule.asp ° °Get there early and be prepared to wait. [employer] and [employer] have dental hygienist schools that provide low cost routine dental care.  ° °Other resources: °[facility] °[address] °[place], [place] °[phone] ° °Patients with Medicaid: °[facility]                     [facility] °[address]                                [address] °Phone:  [phone]                                                  Phone:  [phone] ° °If unable to pay or uninsured, contact:  [facility] or [facility] to become qualified for the adult dental clinic. ° °No matter what dental problem you have, it will not get better unless you get good dental care.  If the tooth is not taken care of, your symptoms will come back in time and you will be visiting us again in the Urgent Care Center with a bad toothache.  So, see your dentist as soon as possible.  If you don't have a dentist, we can give you a list of dentists.  Sometimes the most cost effective treatment is removal of the tooth.  This can be done very inexpensively through one of the low cost Affordable Denture Centers such as the facility on [address] in [place] (1-800-336-8873).  The downside to this is that you will have one less tooth and this can effect your ability to chew. ° °Some other things that can be done for a dental infection include the following: ° °· Rinse your mouth out with hot salt water (1/2 tsp of table salt and a pinch of baking soda in 8 oz of hot water).  You can do this every 2 or 3 hours. °· Avoid cold foods, beverages, and cold air.  This will make your symptoms worse. °· Sleep with your head elevated.  Sleeping flat will cause your gums and oral tissues to swell and make them hurt more.  You can sleep on several pillows.  Even  better is to sleep in a recliner with your head higher than your heart. °· For mild to moderate pain, you can take Tylenol, ibuprofen, or Aleve. °· External application of heat by a heating pad, hot water bottle, or hot wet towel can help with pain and speed healing.  You can do this every 2 to 3 hours. Do not fall asleep on a heating pad since this can cause a burn.  °·  °

## 2013-12-27 NOTE — Progress Notes (Signed)
   Subjective:    Patient ID: [name] Christensen, male    DOB: Feb 04, 1986, 28 y.o.   MRN: [id number]  HPI [name] Christensen is a 28 yo male with PMH of H. Pylori gastritis and ongoing dental pain who is seen in followup. He was initially seen in November and H. pylori stool antigen was performed which indicated active H. pylori infection. He was treated with Pylera and returns for followup. He completed a Pylera as prescribed and Twice daily PPI. He has not been on PPI since finishing Pylera. His biggest complaint continues to be dental pain and he has a dentist appointment coming up later this month. His epigastric burning pain has resolved. He does feel an empty feeling in his stomach and this resolves with eating. No vomiting. He has lost about 5 pounds. No further issues with heartburn. Regular bowel movements without melena or rectal bleeding. No abdominal pain. He is not taking any medications at present.   Review of Systems As per history of present illness, otherwise negative  Current Medications, Allergies, Past Medical History, Past Surgical History, Family History and Social History were reviewed in [facility] electronic medical record.     Objective:   Physical Exam BP 136/78  Pulse 88  Ht 5\' 6"  (1.676 m)  Wt 204 lb (92.534 kg)  BMI 32.94 kg/m2 Constitutional: Well-developed and well-nourished. No distress. HEENT: Normocephalic and atraumatic. Oropharynx is clear and moist. No oropharyngeal exudate. Conjunctivae are normal.  No scleral icterus. Neck: Neck supple. Trachea midline. Cardiovascular: Normal rate, regular rhythm and intact distal pulses. No M/R/G Pulmonary/chest: Effort normal and breath sounds normal. No wheezing, rales or rhonchi. Abdominal: Soft, nontender, nondistended. Bowel sounds active throughout. There are no masses palpable. No hepatosplenomegaly. Extremities: no clubbing, cyanosis, or edema Neurological: Alert and oriented to person place and time. Skin: Skin is  warm and dry. No rashes noted. Psychiatric: Normal mood and affect. Behavior is normal.  H pylori stool ag positive - Nov 2014    Assessment & Plan:  28 yo male with PMH of H. Pylori gastritis and ongoing dental pain who is seen in followup. He was initially seen in November and H. pylori stool antigen was performed which indicated active H. pylori infection.   1.  H. Pylori -- overall abdominal complaints have improved with treatment. I would like to confirm eradication and we will repeat the H. pylori stool antigen. If this remains positive he will likely need upper endoscopy with biopsy for complete confirmation of infection.  I do not think he needs PPI at this time as he is not having significant heartburn or dyspepsia. I think his dental infection is contributing to his overall symptoms and this remains his biggest issue. He has an appointment with a dentist soon.  2.  Dental caries/infection -- see #1. He has a dental appointment upcoming and I have encouraged him to keep this. If his dentist put him on antibiotics again, I recommended a probiotic and he was given samples of Restora to take once ddaily.  Return as needed, await confirmation of H. pylori eradication with stool antigen

## 2013-12-27 NOTE — ED Provider Notes (Signed)
  Chief Complaint   Chief Complaint  Patient presents with  . Dental Pain    History of Present Illness   [name] Christensen is a 28 year old male who has had a one-month history of pain in his right, lower, first molar. This tooth has been filled. The posterior cusp broke off. He was here about 3 weeks ago with the same thing. It started hurting a lot and swelling. It hurts to chew on that side but he has no difficulty breathing or swallowing. No fever, chills, headache, neck pain, or swelling. He was given clindamycin and tramadol. He felt better for a while, he had an appointment earlier this week to see a dentist, he was a few minutes late for the appointment and they canceled the appointment. He will have to come back in January 29. In the meantime he needs something for the pain and the infection.  Review of Systems   Other than as noted above, the patient denies any of the following symptoms: Systemic:  No fever, chills,  Or sweats. ENT:  No headache, ear ache, sore throat, nasal congestion, facial pain, or swelling. Lymphatic:  No adenopathy. Lungs:  No coughing, wheezing or shortness of breath.  PMFSH   Past medical history, family history, social history, meds, and allergies were reviewed.  Physical Examination     Vital signs:  BP 143/86  Pulse 89  Temp(Src) 99.5 F (37.5 C) (Oral)  Resp 24  SpO2 100% General:  Alert, oriented, in no distress. ENT:  TMs and canals normal.  Nasal mucosa normal. Mouth exam:  He has several carious teeth. The tooth impression is a right, lower, first molar. The posterior, limbal cusp is broken off and decayed. The tooth has been filled. It's tender to touch. There is no swelling of the gingiva, no collection of pus, no swelling of the floor the mouth of the tongue, pharynx is clear the airway is widely patent. Neck:  No swelling or adenopathy. Lungs:  Breath sounds clear and equal bilaterally.  No wheezes, rales or rhonchi. Heart:  Regular  rhythm.  No gallops or murmers. Skin:  Clear, warm and dry.   Course in Urgent Care Center   Dental gel was applied.  Assessment   The encounter diagnosis was Pulpitis.  Plan   1.  Meds:  The following meds were prescribed:   Discharge Medication List as of 12/27/2013  6:08 PM    START taking these medications   Details  clindamycin (CLEOCIN) 300 MG capsule Take 1 capsule (300 mg total) by mouth 4 (four) times daily., Starting 12/27/2013, Until Discontinued, Normal    meloxicam (MOBIC) 15 MG tablet Take 1 tablet (15 mg total) by mouth daily., Starting 12/27/2013, Until Discontinued, Normal    oxyCODONE-acetaminophen (PERCOCET) 5-325 MG per tablet 1 to 2 tablets every 6 hours as needed for pain., Print        2.  Patient Education/Counseling:  The patient was given appropriate handouts, self care instructions, and instructed in symptomatic relief. Suggested sleeping with head of bed elevated and hot salt water mouthwash.  3.  Follow up:  The patient was told to follow up if no better in 3 to 4 days, if becoming worse in any way, and given some red flag symptoms such as difficulty swallowing or breathing which would prompt immediate return.  Follow up with a dentist as soon as posssible.     Reuben Likesavid C Domonik Levario, MD 12/27/13 [phone]

## 2013-12-27 NOTE — Patient Instructions (Signed)
Your physician has requested that you go to the basement for the following lab work before leaving today: H. Pylori stool antigen.  If your dentist puts you on antibiotics, please start Restora daily.   Please follow up with Dr. Rhea BeltonPyrtle as needed.

## 2013-12-27 NOTE — ED Notes (Signed)
Toothache, bottom right.  Visible swelling to jaw

## 2014-01-02 ENCOUNTER — Other Ambulatory Visit: Payer: No Typology Code available for payment source

## 2014-01-02 DIAGNOSIS — R768 Other specified abnormal immunological findings in serum: Secondary | ICD-10-CM

## 2014-01-03 LAB — HELICOBACTER PYLORI  SPECIAL ANTIGEN: H. PYLORI ANTIGEN STOOL: NEGATIVE

## 2014-01-05 ENCOUNTER — Telehealth: Payer: Self-pay | Admitting: Internal Medicine

## 2014-01-05 NOTE — Telephone Encounter (Signed)
Notes Recorded by Beverley FiedlerJay M Pyrtle, MD on 01/03/2014 at 12:35 PM H. pylori stool antigen negative indicating infection was completely treated      Informed pt who stated understanding.

## 2014-01-24 ENCOUNTER — Ambulatory Visit: Payer: No Typology Code available for payment source | Attending: Internal Medicine

## 2014-02-21 ENCOUNTER — Encounter: Payer: Self-pay | Admitting: Internal Medicine

## 2014-02-21 ENCOUNTER — Ambulatory Visit: Payer: No Typology Code available for payment source | Attending: Internal Medicine | Admitting: Internal Medicine

## 2014-02-21 VITALS — BP 125/82 | HR 77 | Temp 98.2°F | Resp 17

## 2014-02-21 DIAGNOSIS — K029 Dental caries, unspecified: Secondary | ICD-10-CM

## 2014-02-21 DIAGNOSIS — Z09 Encounter for follow-up examination after completed treatment for conditions other than malignant neoplasm: Secondary | ICD-10-CM

## 2014-02-21 DIAGNOSIS — E669 Obesity, unspecified: Secondary | ICD-10-CM

## 2014-02-21 DIAGNOSIS — Z139 Encounter for screening, unspecified: Secondary | ICD-10-CM

## 2014-02-21 LAB — CBC WITH DIFFERENTIAL/PLATELET
BASOS ABS: 0 10*3/uL (ref 0.0–0.1)
Basophils Relative: 0 % (ref 0–1)
Eosinophils Absolute: 0.1 10*3/uL (ref 0.0–0.7)
Eosinophils Relative: 1 % (ref 0–5)
HEMATOCRIT: 47.6 % (ref 39.0–52.0)
Hemoglobin: 16.6 g/dL (ref 13.0–17.0)
LYMPHS PCT: 41 % (ref 12–46)
Lymphs Abs: 2.7 10*3/uL (ref 0.7–4.0)
MCH: 31 pg (ref 26.0–34.0)
MCHC: 34.9 g/dL (ref 30.0–36.0)
MCV: 88.8 fL (ref 78.0–100.0)
MONO ABS: 0.7 10*3/uL (ref 0.1–1.0)
Monocytes Relative: 11 % (ref 3–12)
NEUTROS ABS: 3.1 10*3/uL (ref 1.7–7.7)
NEUTROS PCT: 47 % (ref 43–77)
Platelets: 225 10*3/uL (ref 150–400)
RBC: 5.36 MIL/uL (ref 4.22–5.81)
RDW: 13.5 % (ref 11.5–15.5)
WBC: 6.5 10*3/uL (ref 4.0–10.5)

## 2014-02-21 LAB — COMPLETE METABOLIC PANEL WITH GFR
ALBUMIN: 4.3 g/dL (ref 3.5–5.2)
ALT: 134 U/L — AB (ref 0–53)
AST: 63 U/L — AB (ref 0–37)
Alkaline Phosphatase: 66 U/L (ref 39–117)
BUN: 9 mg/dL (ref 6–23)
CALCIUM: 9.5 mg/dL (ref 8.4–10.5)
CHLORIDE: 101 meq/L (ref 96–112)
CO2: 29 mEq/L (ref 19–32)
Creat: 0.87 mg/dL (ref 0.50–1.35)
GFR, Est African American: >89 mL/min
GFR, Est Non African American: >89 mL/min
Glucose, Bld: 97 mg/dL (ref 70–99)
POTASSIUM: 4.2 meq/L (ref 3.5–5.3)
SODIUM: 137 meq/L (ref 135–145)
TOTAL PROTEIN: 7.2 g/dL (ref 6.0–8.3)
Total Bilirubin: 0.6 mg/dL (ref 0.2–1.2)

## 2014-02-21 LAB — LIPID PANEL
CHOL/HDL RATIO: 2.4 ratio
Cholesterol: 73 mg/dL (ref 0–200)
HDL: 31 mg/dL — AB (ref >39)
LDL CALC: 25 mg/dL (ref 0–99)
Triglycerides: 86 mg/dL (ref <150)
VLDL: 17 mg/dL (ref 0–40)

## 2014-02-21 LAB — TSH: TSH: 1.295 u[IU]/mL (ref 0.350–4.500)

## 2014-02-21 NOTE — Progress Notes (Signed)
MRN: [id number] Name: [name] SlatesRamon Christensen  Sex: male Age: 28 y.o. DOB: 05-17-1986  Allergies: Penicillins  Chief Complaint  Patient presents with  . Follow-up    HPI: Patient is 28 y.o. male who comes today for followup history of H. pylori infection followed up with the GI already treated, also has lot of dental cavities currently following up with the dentist, denies any acute symptoms, has not had any blood work done patient has family history of diabetes and he is obese.   Past Medical History  Diagnosis Date  . H. pylori infection     Past Surgical History  Procedure Laterality Date  . Nasal sinus surgery        Medication List       This list is accurate as of: 02/21/14 11:58 AM.  Always use your most recent med list.               clindamycin 300 MG capsule  Commonly known as:  CLEOCIN  Take 1 capsule (300 mg total) by mouth 4 (four) times daily.     meloxicam 15 MG tablet  Commonly known as:  MOBIC  Take 1 tablet (15 mg total) by mouth daily.     oxyCODONE-acetaminophen 5-325 MG per tablet  Commonly known as:  PERCOCET  1 to 2 tablets every 6 hours as needed for pain.     traMADol 50 MG tablet  Commonly known as:  ULTRAM  Take 1 tablet (50 mg total) by mouth every 6 (six) hours as needed.        No orders of the defined types were placed in this encounter.    There is no immunization history for the selected administration types on file for this patient.  Family History  Problem Relation Age of Onset  . Breast cancer      great aunt  . Diabetes Paternal Uncle   . Diabetes Maternal Grandmother   . Kidney disease Mother     History  Substance Use Topics  . Smoking status: Never Smoker   . Smokeless tobacco: Never Used  . Alcohol Use: No    Review of Systems   As noted in HPI  Filed Vitals:   02/21/14 1143  BP: 125/82  Pulse: 77  Temp: 98.2 F (36.8 C)  Resp: 17    Physical Exam  Physical Exam  Constitutional: No distress.    HENT:  Dental cavities   Eyes: EOM are normal. Pupils are equal, round, and reactive to light.  Cardiovascular: Normal rate and regular rhythm.   Pulmonary/Chest: Breath sounds normal. No respiratory distress. He has no wheezes. He has no rales.    CBC No results found for this basename: wbc, rbc, hgb, hct, plt, mcv, neutrabs, lymphsabs, monoabs, eosabs, basosabs    CMP  No results found for this basename: na, k, cl, co2, glucose, bun, creatinine, calcium, prot, albumin, ast, alt, alkphos, bilitot, gfrnonaa, gfraa    No results found for this basename: chol, tri, ldl    No components found with this basename: hga1c    No results found for this basename: AST    Assessment and Plan  Follow up  Screening - Plan: CBC with Differential, COMPLETE METABOLIC PANEL WITH GFR, TSH, Lipid panel, Vit D  25 hydroxy (rtn osteoporosis monitoring)  Dental cavities Following up with his dentist  Obesity, unspecified Advised patient for diet and exercise.    Return in about 3 months (around 05/24/2014).  Doris CheadleADVANI, Charina Fons, MD

## 2014-02-21 NOTE — Progress Notes (Signed)
Patient here for follow up Was seen at GI for [facility]-pylori

## 2014-02-22 ENCOUNTER — Other Ambulatory Visit: Payer: Self-pay | Admitting: Internal Medicine

## 2014-02-22 DIAGNOSIS — R945 Abnormal results of liver function studies: Secondary | ICD-10-CM

## 2014-02-22 DIAGNOSIS — R7989 Other specified abnormal findings of blood chemistry: Secondary | ICD-10-CM

## 2014-02-22 LAB — VITAMIN D 25 HYDROXY (VIT D DEFICIENCY, FRACTURES): VIT D 25 HYDROXY: 24 ng/mL — AB (ref 30–89)

## 2014-02-23 ENCOUNTER — Telehealth: Payer: Self-pay | Admitting: Emergency Medicine

## 2014-02-23 NOTE — Telephone Encounter (Signed)
Message copied by Darlis LoanSMITH, JILL D on Fri Feb 23, 2014  2:32 PM ------      Message from: Doris CheadleADVANI, DEEPAK      Created: Thu Feb 22, 2014  9:15 AM       Blood work reviewed, noticed low vitamin D, call patient advise to start ergocalciferol 50,000 units once a week for the duration of  12 weeks.      Noticed abnormal LFTs, ordered hepatitis panel, advise patient to do blood work prior to the next visit. Also advise patient to avoid alcohol and Tylenol. ------

## 2014-02-23 NOTE — Telephone Encounter (Signed)
Attempted to reach pt but no voicemail set up 

## 2014-04-20 ENCOUNTER — Encounter (HOSPITAL_COMMUNITY): Payer: Self-pay | Admitting: Emergency Medicine

## 2014-04-20 ENCOUNTER — Emergency Department (HOSPITAL_COMMUNITY)
Admission: EM | Admit: 2014-04-20 | Discharge: 2014-04-20 | Disposition: A | Discharge status: Home / Self Care | Payer: No Typology Code available for payment source | Attending: Emergency Medicine | Admitting: Emergency Medicine

## 2014-04-20 DIAGNOSIS — Z880 Allergy status to penicillin: Secondary | ICD-10-CM | POA: Insufficient documentation

## 2014-04-20 DIAGNOSIS — K5289 Other specified noninfective gastroenteritis and colitis: Secondary | ICD-10-CM | POA: Insufficient documentation

## 2014-04-20 DIAGNOSIS — Z791 Long term (current) use of non-steroidal anti-inflammatories (NSAID): Secondary | ICD-10-CM | POA: Insufficient documentation

## 2014-04-20 DIAGNOSIS — Z792 Long term (current) use of antibiotics: Secondary | ICD-10-CM | POA: Insufficient documentation

## 2014-04-20 DIAGNOSIS — Z8619 Personal history of other infectious and parasitic diseases: Secondary | ICD-10-CM | POA: Insufficient documentation

## 2014-04-20 DIAGNOSIS — K529 Noninfective gastroenteritis and colitis, unspecified: Secondary | ICD-10-CM

## 2014-04-20 HISTORY — DX: Noninfective gastroenteritis and colitis, unspecified: K52.9

## 2014-04-20 LAB — COMPREHENSIVE METABOLIC PANEL
ALBUMIN: 4.4 g/dL (ref 3.5–5.2)
ALT: 96 U/L — AB (ref 0–53)
AST: 52 U/L — AB (ref 0–37)
Alkaline Phosphatase: 91 U/L (ref 39–117)
BUN: 20 mg/dL (ref 6–23)
CALCIUM: 9.6 mg/dL (ref 8.4–10.5)
CHLORIDE: 99 meq/L (ref 96–112)
CO2: 23 meq/L (ref 19–32)
CREATININE: 0.99 mg/dL (ref 0.50–1.35)
GFR calc Af Amer: >90 mL/min (ref >90)
Glucose, Bld: 127 mg/dL — ABNORMAL HIGH (ref 70–99)
Potassium: 3.9 mEq/L (ref 3.7–5.3)
SODIUM: 137 meq/L (ref 137–147)
Total Bilirubin: 0.5 mg/dL (ref 0.3–1.2)
Total Protein: 8.2 g/dL (ref 6.0–8.3)

## 2014-04-20 LAB — CBC WITH DIFFERENTIAL/PLATELET
BASOS ABS: 0 10*3/uL (ref 0.0–0.1)
BASOS PCT: 0 % (ref 0–1)
Eosinophils Absolute: 0 10*3/uL (ref 0.0–0.7)
Eosinophils Relative: 0 % (ref 0–5)
HCT: 47.2 % (ref 39.0–52.0)
Hemoglobin: 16.9 g/dL (ref 13.0–17.0)
LYMPHS PCT: 14 % (ref 12–46)
Lymphs Abs: 2.2 10*3/uL (ref 0.7–4.0)
MCH: 31.3 pg (ref 26.0–34.0)
MCHC: 35.8 g/dL (ref 30.0–36.0)
MCV: 87.4 fL (ref 78.0–100.0)
Monocytes Absolute: 1.3 10*3/uL — ABNORMAL HIGH (ref 0.1–1.0)
Monocytes Relative: 8 % (ref 3–12)
NEUTROS ABS: 12.2 10*3/uL — AB (ref 1.7–7.7)
Neutrophils Relative %: 77 % (ref 43–77)
PLATELETS: 214 10*3/uL (ref 150–400)
RBC: 5.4 MIL/uL (ref 4.22–5.81)
RDW: 12.4 % (ref 11.5–15.5)
WBC: 15.8 10*3/uL — AB (ref 4.0–10.5)

## 2014-04-20 LAB — LIPASE, BLOOD: Lipase: 37 U/L (ref 11–59)

## 2014-04-20 MED ORDER — SODIUM CHLORIDE 0.9 % IV BOLUS (SEPSIS)
1000.0000 mL | Freq: Once | INTRAVENOUS | Status: AC
  Administered 2014-04-20: 1000 mL via INTRAVENOUS

## 2014-04-20 MED ORDER — ONDANSETRON HCL 4 MG PO TABS: 4.0000 mg | ORAL_TABLET | Freq: Three times a day (TID) | ORAL | Status: DC | PRN

## 2014-04-20 NOTE — Discharge Instructions (Signed)
Gastroenteritis viral  (Viral Gastroenteritis)  La gastroenteritis viral también es conocida como gripe del estómago. Este trastorno afecta el estómago y el tubo digestivo. Puede causar diarrea y vómitos repentinos. La enfermedad generalmente dura entre 3 y 8 días. La mayoría de las personas desarrolla una respuesta inmunológica. Con el tiempo, esto elimina el virus. Mientras se desarrolla esta respuesta natural, el virus puede afectar en forma importante su salud.   CAUSAS  Muchos virus diferentes pueden causar gastroenteritis, por ejemplo el rotavirus o el norovirus. Estos virus pueden contagiarse al consumir alimentos o agua contaminados. También puede contagiarse al compartir utensilios u otros artículos personales con una persona infectada o al tocar una superficie contaminada.   SÍNTOMAS  Los síntomas más comunes son diarrea y vómitos. Estos problemas pueden causar una pérdida grave de líquidos corporales(deshidratación) y un desequilibrio de sales corporales(electrolitos). Otros síntomas pueden ser:   · Fiebre.  · Dolor de cabeza.  · Fatiga.  · Dolor abdominal.  DIAGNÓSTICO   El médico podrá hacer el diagnóstico de gastroenteritis viral basándose en los síntomas y el examen físico También pueden tomarle una muestra de materia fecal para diagnosticar la presencia de virus u otras infecciones.   TRATAMIENTO  Esta enfermedad generalmente desaparece sin tratamiento. Los tratamientos están dirigidos a la rehidratación. Los casos más graves de gastroenteritis viral implican vómitos tan intensos que no es posible retener líquidos. En estos casos, los líquidos deben administrarse a través de una vía intravenosa (IV).   INSTRUCCIONES PARA EL CUIDADO DOMICILIARIO  · Beba suficientes líquidos para mantener la orina clara o de color amarillo pálido. Beba pequeñas cantidades de líquido con frecuencia y aumente la cantidad según la tolerancia.  · Pida instrucciones específicas a su médico con respecto a la  rehidratación.  · Evite:  · Alimentos que tengan mucha azúcar.  · Alcohol.  · Gaseosas.  · Tabaco.  · Jugos.  · Bebidas con cafeína.  · Líquidos muy calientes o fríos.  · Alimentos muy grasos.  · Comer demasiado a la vez.  · Productos lácteos hasta 24 a 48 horas después de que se detenga la diarrea.  · Puede consumir probióticos. Los probióticos son cultivos activos de bacterias beneficiosas. Pueden disminuir la cantidad y el número de deposiciones diarreicas en el adulto. Se encuentran en los yogures con cultivos activos y en los suplementos.  · Lave bien sus manos para evitar que se disemine el virus.  · Sólo tome medicamentos de venta libre o recetados para calmar el dolor, las molestias o bajar la fiebre según las indicaciones de su médico. No administre aspirina a los niños. Los medicamentos antidiarreicos no son recomendables.  · Consulte a su médico si puede seguir tomando sus medicamentos recetados o de venta libre.  · Cumpla con todas las visitas de control, según le indique su médico.  SOLICITE ATENCIÓN MÉDICA DE INMEDIATO SI:  · No puede retener líquidos.  · No hay emisión de orina durante 6 a 8 horas.  · Le falta el aire.  · Observa sangre en el vómito (se ve como café molido) o en la materia fecal.  · Siente dolor abdominal que empeora o se concentra en una zona pequeña (se localiza).  · Tiene náuseas o vómitos persistentes.  · Tiene fiebre.  · El paciente es un niño menor de 3 meses y tiene fiebre.  · El paciente es un niño mayor de 3 meses, tiene fiebre y síntomas persistentes.  · El paciente es un niño mayor de 3 meses   y tiene fiebre y síntomas que empeoran repentinamente.  · El paciente es un bebé y no tiene lágrimas cuando llora.  ASEGÚRESE QUE:   · Comprende estas instrucciones.  · Controlará su enfermedad.  · Solicitará ayuda inmediatamente si no mejora o si empeora.  Document Released: 11/30/2005 Document Revised: 02/22/2012  Exit[employer]® Patient Information ©2014 [employer].

## 2014-04-20 NOTE — ED Notes (Signed)
Bed: WA09 Expected date:  Expected time:  Means of arrival:  Comments: EMS, 29, NVD

## 2014-04-20 NOTE — Progress Notes (Signed)
  CARE MANAGEMENT ED NOTE 04/20/2014  Patient:  [name] Christensen,[name] Christensen   Account Number:  [id number]  Date Initiated:  04/20/2014  Documentation initiated by:  Radford PaxFERRERO,Everlynn Sagun  Subjective/Objective Assessment:   patient presents to Ed with n/v/d.     Subjective/Objective Assessment Detail:     Action/Plan:   Action/Plan Detail:   Anticipated DC Date:  04/20/2014     Status Recommendation to Physician:   Result of Recommendation:    Other ED Services  Consult Working Plan    DC Planning Services  Other  PCP issues    Choice offered to / List presented to:            Status of service:  Completed, signed off  ED Comments:   ED Comments Detail:  EDCM spoke to patient at bedside.  Patient confirms he has the orange card.  Patient[facility] as [facility] on the orange card [facility]. Patient was not aware of this.  [facility] copay for pcp visits is ten dollars.  Patient thankful for information.  No further EDCM needs at this time.

## 2014-04-20 NOTE — ED Notes (Signed)
Per EMS-N/V/D that started today-given Zofran 4 mg IV given in route

## 2014-04-20 NOTE — ED Provider Notes (Signed)
CSN: [id number]     Arrival date & time 04/20/14  1902 History   First MD Initiated Contact with Patient 04/20/14 1910     Chief Complaint  Patient presents with  . N/V/D      (Consider location/radiation/quality/duration/timing/severity/associated sxs/prior Treatment) HPI Pt with no significant PMH reports onset of loose stools/diarrhea this morning, after work and eating an apple, he began to vomit. Reports multiple episodes of non-bloody emesis, some mild upper abdominal discomfort. No fever, no sick contacts.   Past Medical History  Diagnosis Date  . H. pylori infection    Past Surgical History  Procedure Laterality Date  . Nasal sinus surgery     Family History  Problem Relation Age of Onset  . Breast cancer      great aunt  . Diabetes Paternal Uncle   . Diabetes Maternal Grandmother   . Kidney disease Mother    History  Substance Use Topics  . Smoking status: Never Smoker   . Smokeless tobacco: Never Used  . Alcohol Use: No    Review of Systems  All other systems reviewed and are negative except as noted in HPI.     Allergies  Penicillins  Home Medications   Prior to Admission medications   Medication Sig Start Date End Date Taking? Authorizing Provider  clindamycin (CLEOCIN) 300 MG capsule Take 1 capsule (300 mg total) by mouth 4 (four) times daily. 12/27/13   Reuben Likesavid C Keller, MD  meloxicam (MOBIC) 15 MG tablet Take 1 tablet (15 mg total) by mouth daily. 12/27/13   Reuben Likesavid C Keller, MD  oxyCODONE-acetaminophen (PERCOCET) 5-325 MG per tablet 1 to 2 tablets every 6 hours as needed for pain. 12/27/13   Reuben Likesavid C Keller, MD  traMADol (ULTRAM) 50 MG tablet Take 1 tablet (50 mg total) by mouth every 6 (six) hours as needed. 12/05/13   Rodolph BongEvan S Corey, MD   BP 126/72  Pulse 99  Temp(Src) 98.2 F (36.8 C) (Oral)  Resp 20  SpO2 96% Physical Exam  Nursing note and vitals reviewed. Constitutional: He is oriented to person, place, and time. He appears well-developed and  well-nourished.  HENT:  Head: Normocephalic and atraumatic.  Eyes: EOM are normal. Pupils are equal, round, and reactive to light.  Neck: Normal range of motion. Neck supple.  Cardiovascular: Normal rate, normal heart sounds and intact distal pulses.   Pulmonary/Chest: Effort normal and breath sounds normal.  Abdominal: Bowel sounds are normal. He exhibits no distension. There is no tenderness. There is no rebound and no guarding.  Musculoskeletal: Normal range of motion. He exhibits no edema and no tenderness.  Neurological: He is alert and oriented to person, place, and time. He has normal strength. No cranial nerve deficit or sensory deficit.  Skin: Skin is warm and dry. No rash noted.  Psychiatric: He has a normal mood and affect.    ED Course  Procedures (including critical care time) Labs Review Labs Reviewed  CBC WITH DIFFERENTIAL - Abnormal; Notable for the following:    WBC 15.8 (*)    Neutro Abs 12.2 (*)    Monocytes Absolute 1.3 (*)    All other components within normal limits  COMPREHENSIVE METABOLIC PANEL - Abnormal; Notable for the following:    Glucose, Bld 127 (*)    AST 52 (*)    ALT 96 (*)    All other components within normal limits  LIPASE, BLOOD    Imaging Review No results found.   EKG Interpretation None  MDM   Final diagnoses:  Gastroenteritis    Pt feeling better, no vomiting in the ED after Zofran given en route. Will attempt PO trial, anticipate discharge.     Charles B. Bernette MayersSheldon, MD 04/20/14 2147

## 2014-05-25 ENCOUNTER — Ambulatory Visit: Payer: No Typology Code available for payment source | Attending: Internal Medicine | Admitting: *Deleted

## 2014-05-25 DIAGNOSIS — R112 Nausea with vomiting, unspecified: Secondary | ICD-10-CM

## 2014-05-25 MED ORDER — METOCLOPRAMIDE HCL 10 MG PO TABS: 10.0000 mg | ORAL_TABLET | Freq: Three times a day (TID) | ORAL | Status: DC

## 2014-05-25 MED ORDER — CALCIUM & MAGNESIUM CARBONATES 311-232 MG PO TABS: 1.0000 | ORAL_TABLET | Freq: Two times a day (BID) | ORAL | Status: DC

## 2014-05-25 NOTE — Patient Instructions (Signed)
If symptoms persist or worsen after 7-14 days please follow up with your GI doctor.

## 2014-05-25 NOTE — Progress Notes (Unsigned)
Patient presents today with nausea and vomiting x 3days. Patient was seen in the ED on 04/20/2014 and diagnosed with Gastroenteritis. Patient states he was prescribed Zofran which he states it did not help. Patient has a history of H.Pylori. Consulted with Dr. Orpah CobbAdvani who prescribed Mylanta and Reglan, and states the patient needs to f/u with his GI doctor.

## 2014-06-06 ENCOUNTER — Telehealth: Payer: Self-pay | Admitting: Internal Medicine

## 2014-06-06 NOTE — Telephone Encounter (Signed)
Park LiterWendy Osorio spoke with the patient in Spanish.  He reported to her 15 days of abdominal pain and nausea.  Worse after a meal.  He denies diarrhea or constipation.  He will come in and see Mike Gipmy Esterwood PA on 06/12/14 3:00

## 2014-06-12 ENCOUNTER — Ambulatory Visit (INDEPENDENT_AMBULATORY_CARE_PROVIDER_SITE_OTHER): Payer: No Typology Code available for payment source | Admitting: Physician Assistant

## 2014-06-12 ENCOUNTER — Encounter: Payer: Self-pay | Admitting: Physician Assistant

## 2014-06-12 VITALS — BP 124/78 | HR 92 | Ht 65.5 in | Wt 213.4 lb

## 2014-06-12 DIAGNOSIS — K2940 Chronic atrophic gastritis without bleeding: Secondary | ICD-10-CM

## 2014-06-12 DIAGNOSIS — B9681 Helicobacter pylori [H. pylori] as the cause of diseases classified elsewhere: Secondary | ICD-10-CM

## 2014-06-12 DIAGNOSIS — A048 Other specified bacterial intestinal infections: Secondary | ICD-10-CM

## 2014-06-12 DIAGNOSIS — R112 Nausea with vomiting, unspecified: Secondary | ICD-10-CM

## 2014-06-12 DIAGNOSIS — K2970 Gastritis, unspecified, without bleeding: Secondary | ICD-10-CM

## 2014-06-12 DIAGNOSIS — R1013 Epigastric pain: Secondary | ICD-10-CM

## 2014-06-12 MED ORDER — ONDANSETRON HCL 4 MG PO TABS: 4.0000 mg | ORAL_TABLET | Freq: Three times a day (TID) | ORAL | Status: DC | PRN

## 2014-06-12 MED ORDER — FAMOTIDINE 20 MG PO TABS
20.0000 mg | ORAL_TABLET | Freq: Two times a day (BID) | ORAL | Status: DC
Start: 2014-06-12 — End: 2014-11-05

## 2014-06-12 NOTE — Progress Notes (Addendum)
    Subjective:    Patient ID: [name] Christensen, male    DOB: 1986-09-22, 28 y.o.   MRN: [id number]  HPI  [name] Christensen is a 28 yo male known recently to Dr. Rhea BeltonPyrtle. He has  hx of hpylori, and relates [place] 6 years ago showed h.pylori, andhe was treated. He had + Hpylori stool Ag in 10/2013, and was treated with a course of Pylera. He had stool Ag repeated to confirm eradication  In Jan. 2015 , and this was negative. He comes in now with 2 month hx of nausea, intermittent vomiting, and vague epigastric discomfort.No fever,chills,diarrhea. Appetite Ok, no weight loss. Occasional indigestion.Pt reports streaks of blood in emesis off and on. No ASA or Nsaid use. Unable to take Prilosec or Zegerid, because they cause dizziness.Relates daily nausea.    Review of Systems  Constitutional: Negative.   HENT: Negative.   Eyes: Negative.   Respiratory: Negative.   Cardiovascular: Negative.   Gastrointestinal: Positive for nausea, vomiting and abdominal pain.  Endocrine: Negative.   Genitourinary: Negative.   Musculoskeletal: Negative.   Skin: Negative.   Allergic/Immunologic: Negative.   Neurological: Negative.   Hematological: Negative.   Psychiatric/Behavioral: Negative.    Outpatient Prescriptions Prior to Visit  Medication Sig Dispense Refill  . calcium & magnesium carbonates (MYLANTA) 311-232 MG per tablet Take 1 tablet by mouth 2 (two) times daily.  30 tablet  0  . metoCLOPramide (REGLAN) 10 MG tablet Take 1 tablet (10 mg total) by mouth 3 (three) times daily before meals.  30 tablet  0  . ondansetron (ZOFRAN) 4 MG tablet Take 1 tablet (4 mg total) by mouth every 8 (eight) hours as needed for nausea or vomiting.  12 tablet  0   No facility-administered medications prior to visit.   Allergies  Allergen Reactions  . Penicillins     Increased heart rate   Patient Active Problem List   Diagnosis Date Noted  . H. pylori infection 12/27/2013  . Pain, dental 10/26/2013        History   Social History Narrative  . No narrative on file  family history includes Breast cancer in an other family member; Diabetes in his maternal grandmother and paternal uncle; Kidney disease in his mother.   Objective:   Physical Exam  WD young male in NAD. BP 124/78,P:92 Heent; Unremarkable, Neck; supple ,nonJVD, CV; RRR with S1S2, Pulm; clear bilat Abd; soft , mild tenderness epigastrium, no guarding or rebound, no mass or HSM,BS+, Rectal ; not done, Ext ;no CCE, Neuro/psych; affect normal and apprpriate       Assessment & Plan:   #1 28 yo male with hx of H.pylori gastritis -treated fall 2014, sxs improved but never completely resolved, now with 2 month hx of frequent nausea, intermittent vomiting,(occasional scant blood streaked), indigestion and epigastric discomfort - R/O  Gb disease, PUD,  gastroparesis  Plan: Schedule for Abdominal US Schedule for EGD with Dr. Alona BenePyrtle-procedure discussed in detail with pt, and he is agreeable to proceed Intolerant to PPI's?-give triial of Pepcid  20 mg BID Zofran 4 mg q 6 hours prn  Addendum: Reviewed and agree with initial management. Ultrasound reveals fatty liver, needs labs for exclusion of other causes of transaminitis EGD today [name] FiedlerJay M Pyrtle, MD

## 2014-06-12 NOTE — Patient Instructions (Addendum)
You have been scheduled for an abdominal ultrasound [facility] (1st floor of hospital) on THursday 06-14-2014 at 9:30am. Please arrive 15 minutes prior to your appointment for registration. Make certain not to have anything to eat or drink 6 hours prior to your appointment. Should you need to reschedule your appointment, please contact radiology at [phone]. This test typically takes about 30 minutes to perform.  We [facility] a [facility] [address]. Take 1 tab twice daily.  You have been scheduled for an endoscopy. Please follow written instructions given to you at your visit today. If you use inhalers (even only as needed), please bring them with you on the day of your procedure. Your physician has requested that you go to www.startemmi.com and enter the access code given to you at your visit today. This web site gives a general overview about your procedure. However, you should still follow specific instructions given to you by our office regarding your preparation for the procedure.

## 2014-06-14 ENCOUNTER — Ambulatory Visit (HOSPITAL_COMMUNITY)
Admission: RE | Admit: 2014-06-14 | Discharge: 2014-06-14 | Disposition: A | Discharge status: Home / Self Care | Payer: No Typology Code available for payment source | Source: Ambulatory Visit | Attending: Physician Assistant | Admitting: Physician Assistant

## 2014-06-14 ENCOUNTER — Other Ambulatory Visit: Payer: Self-pay | Admitting: *Deleted

## 2014-06-14 ENCOUNTER — Encounter: Payer: Self-pay | Admitting: *Deleted

## 2014-06-14 DIAGNOSIS — B9681 Helicobacter pylori [H. pylori] as the cause of diseases classified elsewhere: Secondary | ICD-10-CM

## 2014-06-14 DIAGNOSIS — R160 Hepatomegaly, not elsewhere classified: Secondary | ICD-10-CM | POA: Insufficient documentation

## 2014-06-14 DIAGNOSIS — K2970 Gastritis, unspecified, without bleeding: Secondary | ICD-10-CM

## 2014-06-14 DIAGNOSIS — R109 Unspecified abdominal pain: Secondary | ICD-10-CM | POA: Insufficient documentation

## 2014-06-14 DIAGNOSIS — R112 Nausea with vomiting, unspecified: Secondary | ICD-10-CM | POA: Insufficient documentation

## 2014-06-14 DIAGNOSIS — R1013 Epigastric pain: Secondary | ICD-10-CM

## 2014-06-14 DIAGNOSIS — K760 Fatty (change of) liver, not elsewhere classified: Secondary | ICD-10-CM

## 2014-06-18 ENCOUNTER — Other Ambulatory Visit: Payer: No Typology Code available for payment source

## 2014-06-18 ENCOUNTER — Ambulatory Visit (AMBULATORY_SURGERY_CENTER): Payer: Self-pay | Admitting: Internal Medicine

## 2014-06-18 ENCOUNTER — Encounter: Payer: Self-pay | Admitting: Internal Medicine

## 2014-06-18 VITALS — BP 122/68 | HR 80 | Temp 98.8°F | Resp 16 | Ht 65.5 in | Wt 213.0 lb

## 2014-06-18 DIAGNOSIS — R945 Abnormal results of liver function studies: Secondary | ICD-10-CM

## 2014-06-18 DIAGNOSIS — K2990 Gastroduodenitis, unspecified, without bleeding: Secondary | ICD-10-CM

## 2014-06-18 DIAGNOSIS — R7989 Other specified abnormal findings of blood chemistry: Secondary | ICD-10-CM

## 2014-06-18 DIAGNOSIS — A048 Other specified bacterial intestinal infections: Secondary | ICD-10-CM

## 2014-06-18 DIAGNOSIS — R112 Nausea with vomiting, unspecified: Secondary | ICD-10-CM

## 2014-06-18 DIAGNOSIS — R1013 Epigastric pain: Secondary | ICD-10-CM

## 2014-06-18 DIAGNOSIS — K2970 Gastritis, unspecified, without bleeding: Secondary | ICD-10-CM

## 2014-06-18 MED ORDER — PANTOPRAZOLE SODIUM 40 MG PO TBEC: 40.0000 mg | DELAYED_RELEASE_TABLET | Freq: Every day | ORAL | Status: DC

## 2014-06-18 MED ORDER — SODIUM CHLORIDE 0.9 % IV SOLN: 500.0000 mL | INTRAVENOUS | Status: DC

## 2014-06-18 NOTE — Op Note (Signed)
[facility] [address] [place], [id number]   ENDOSCOPY PROCEDURE REPORT  PATIENT: [name] Christensen, [name]  MR#: [id number] BIRTHDATE: [birth year] , 28  yrs. old GENDER: Male ENDOSCOPIST: Beverley FiedlerJay M Anis Degidio, MD PROCEDURE DATE:  06/18/2014 PROCEDURE:  EGD w/ biopsy for H.pylori ASA CLASS:     Class II INDICATIONS:  Epigastric pain.   Nausea.   Vomiting. MEDICATIONS: MAC sedation, administered by CRNA and propofol (Diprivan) 200mg  IV TOPICAL ANESTHETIC: none  DESCRIPTION OF PROCEDURE: After the risks benefits and alternatives of the procedure were thoroughly explained, informed consent was obtained.  The LB [id number] endoscope was introduced through the mouth and advanced to the second portion of the duodenum. Without limitations.  The instrument was slowly withdrawn as the mucosa was fully examined.  ESOPHAGUS: The mucosa of the esophagus appeared normal.   Z line regular at 40 cm.  STOMACH: Food residue was found in the gastric body and fundus which precluded complete visualization of the gastric mucosa..   The mucosa of the stomach that was not obscured by food residue appeared normal.  Biopsies were taken in the body, antrum and angularis.  DUODENUM: The duodenal mucosa showed no abnormalities in the bulb and second portion of the duodenum.  Retroflexed views revealed no abnormalities.     The scope was then withdrawn from the patient and the procedure completed.  COMPLICATIONS: There were no complications.  ENDOSCOPIC IMPRESSION: 1.   The mucosa of the esophagus appeared normal 2.   Retained food in the proximal stomach, precluding complete visualization of gastric mucosa; query gastroparesis 3.   The examined mucosa of the stomach appeared normal; biopsies were taken in the antrum and angularis 4.   The duodenal mucosa showed no abnormalities in the bulb and second portion of the duodenum  RECOMMENDATIONS: 1.  Await biopsy results 2.  Follow-up of  helicobacter pylori status, treat if indicated 3.  Continue taking your PPI (antiacid medicine) once daily.  It is best to be taken 20-30 minutes prior to breakfast meal. 4.  Labs today and office follow-up next available  eSigned:  Beverley FiedlerJay M Deontra Pereyra, MD 06/18/2014 10:01 AM CC:The Patient; [name] Cheadleeepak Advani, MD

## 2014-06-18 NOTE — Progress Notes (Signed)
Procedure ends, to recovery, report given and VSS. 

## 2014-06-18 NOTE — Progress Notes (Signed)
Called to room to assist during endoscopic procedure.  Patient ID and intended procedure confirmed with present staff. Received instructions for my participation in the procedure from the performing physician.  

## 2014-06-18 NOTE — Progress Notes (Signed)
Patient's care partner stating she does not speak english. Called patient's father for transport, reviewed instructions  In his presence. Patient stating understanding. Patient to lab per w/c prior to discharge.

## 2014-06-18 NOTE — Patient Instructions (Addendum)
Stop Omeprazole and Pepcid Start daily Protonix, take 30 minutes prior to morning meal.   YOU HAD AN ENDOSCOPIC PROCEDURE TODAY AT THE [facility]: Refer to the procedure report that was given to you for any specific questions about what was found during the examination.  If the procedure report does not answer your questions, please call your gastroenterologist to clarify.  If you requested that your care partner not be given the details of your procedure findings, then the procedure report has been included in a sealed envelope for you to review at your convenience later.  YOU SHOULD EXPECT: Some feelings of bloating in the abdomen. Passage of more gas than usual.  Walking can help get rid of the air that was put into your GI tract during the procedure and reduce the bloating. If you had a lower endoscopy (such as a colonoscopy or flexible sigmoidoscopy) you may notice spotting of blood in your stool or on the toilet paper. If you underwent a bowel prep for your procedure, then you may not have a normal bowel movement for a few days.  DIET: Your first meal following the procedure should be a light meal and then it is ok to progress to your normal diet.  A half-sandwich or bowl of soup is an example of a good first meal.  Heavy or fried foods are harder to digest and may make you feel nauseous or bloated.  Likewise meals heavy in dairy and vegetables can cause extra gas to form and this can also increase the bloating.  Drink plenty of fluids but you should avoid alcoholic beverages for 24 hours.  ACTIVITY: Your care partner should take you home directly after the procedure.  You should plan to take it easy, moving slowly for the rest of the day.  You can resume normal activity the day after the procedure however you should NOT DRIVE or use heavy machinery for 24 hours (because of the sedation medicines used during the test).    SYMPTOMS TO REPORT IMMEDIATELY: A gastroenterologist can be  reached at any hour.  During normal business hours, 8:30 AM to 5:00 PM Monday through Friday, call [phone].  After hours and on weekends, please call the GI answering service at [phone] who will take a message and have the physician on call contact you.   Following upper endoscopy (EGD)  Vomiting of blood or coffee ground material  New chest pain or pain under the shoulder blades  Painful or persistently difficult swallowing  New shortness of breath  Fever of 100F or higher  Black, tarry-looking stools  FOLLOW UP: If any biopsies were taken you will be contacted by phone or by letter within the next 1-3 weeks.  Call your gastroenterologist if you have not heard about the biopsies in 3 weeks.  Our staff will call the home number listed on your records the next business day following your procedure to check on you and address any questions or concerns that you may have at that time regarding the information given to you following your procedure. This is a courtesy call and so if there is no answer at the home number and we have not heard from you through the emergency physician on call, we will assume that you have returned to your regular daily activities without incident.  SIGNATURES/CONFIDENTIALITY: You and/or your care partner have signed paperwork which will be entered into your electronic medical record.  These signatures attest to the fact that that the  information above on your After Visit Summary has been reviewed and is understood.  Full responsibility of the confidentiality of this discharge information lies with you and/or your care-partner.

## 2014-06-19 ENCOUNTER — Telehealth: Payer: Self-pay

## 2014-06-19 LAB — ANA: ANA: NEGATIVE

## 2014-06-19 LAB — IGG: IgG (Immunoglobin G), Serum: 1500 mg/dL (ref 650–1600)

## 2014-06-19 NOTE — Telephone Encounter (Signed)
  Follow up Call-  Call back number 06/18/2014  Post procedure Call Back phone  # [phone]  Permission to leave phone message Yes     Patient questions:  Do you have a fever, pain , or abdominal swelling? No. Pain Score  0 *  Have you tolerated food without any problems? Yes.    Have you been able to return to your normal activities? Yes.    Do you have any questions about your discharge instructions: Diet   No. Medications  No. Follow up visit  No.  Do you have questions or concerns about your Care? No.  Actions: * If pain score is 4 or above: No action needed, pain <4.

## 2014-06-21 ENCOUNTER — Other Ambulatory Visit (INDEPENDENT_AMBULATORY_CARE_PROVIDER_SITE_OTHER): Payer: No Typology Code available for payment source

## 2014-06-21 DIAGNOSIS — K7689 Other specified diseases of liver: Secondary | ICD-10-CM

## 2014-06-21 DIAGNOSIS — K760 Fatty (change of) liver, not elsewhere classified: Secondary | ICD-10-CM

## 2014-06-21 LAB — LIPID PANEL
CHOL/HDL RATIO: 2
Cholesterol: 90 mg/dL (ref 0–200)
HDL: 38.6 mg/dL — ABNORMAL LOW (ref >39.00)
LDL Cholesterol: 37 mg/dL (ref 0–99)
NONHDL: 51.4
Triglycerides: 70 mg/dL (ref 0.0–149.0)
VLDL: 14 mg/dL (ref 0.0–40.0)

## 2014-06-21 LAB — HEPATITIS C ANTIBODY: HCV Ab: NEGATIVE

## 2014-06-21 LAB — HEPATITIS B SURFACE ANTIGEN: Hepatitis B Surface Ag: NEGATIVE

## 2014-06-21 LAB — HEPATITIS B SURFACE ANTIBODY, QUANTITATIVE: HEPATITIS B-POST: 31.4 m[IU]/mL

## 2014-06-21 LAB — FERRITIN: FERRITIN: 65.8 ng/mL (ref 22.0–322.0)

## 2014-06-22 ENCOUNTER — Encounter: Payer: Self-pay | Admitting: Internal Medicine

## 2014-07-20 ENCOUNTER — Ambulatory Visit: Payer: No Typology Code available for payment source | Attending: Internal Medicine

## 2014-08-07 ENCOUNTER — Ambulatory Visit: Payer: No Typology Code available for payment source | Admitting: Internal Medicine

## 2014-11-05 ENCOUNTER — Encounter (HOSPITAL_COMMUNITY): Payer: Self-pay | Admitting: Emergency Medicine

## 2014-11-05 ENCOUNTER — Emergency Department (HOSPITAL_COMMUNITY)
Admission: EM | Admit: 2014-11-05 | Discharge: 2014-11-05 | Disposition: A | Discharge status: Home / Self Care | Payer: Self-pay | Attending: Emergency Medicine | Admitting: Emergency Medicine

## 2014-11-05 ENCOUNTER — Emergency Department (HOSPITAL_COMMUNITY): Payer: No Typology Code available for payment source

## 2014-11-05 DIAGNOSIS — Z880 Allergy status to penicillin: Secondary | ICD-10-CM | POA: Insufficient documentation

## 2014-11-05 DIAGNOSIS — R109 Unspecified abdominal pain: Secondary | ICD-10-CM

## 2014-11-05 DIAGNOSIS — R7989 Other specified abnormal findings of blood chemistry: Secondary | ICD-10-CM | POA: Insufficient documentation

## 2014-11-05 DIAGNOSIS — R945 Abnormal results of liver function studies: Secondary | ICD-10-CM

## 2014-11-05 DIAGNOSIS — Z8619 Personal history of other infectious and parasitic diseases: Secondary | ICD-10-CM | POA: Insufficient documentation

## 2014-11-05 DIAGNOSIS — R112 Nausea with vomiting, unspecified: Secondary | ICD-10-CM | POA: Insufficient documentation

## 2014-11-05 DIAGNOSIS — Z8719 Personal history of other diseases of the digestive system: Secondary | ICD-10-CM | POA: Insufficient documentation

## 2014-11-05 DIAGNOSIS — R1012 Left upper quadrant pain: Secondary | ICD-10-CM | POA: Insufficient documentation

## 2014-11-05 DIAGNOSIS — G8929 Other chronic pain: Secondary | ICD-10-CM | POA: Insufficient documentation

## 2014-11-05 HISTORY — DX: Abnormal results of liver function studies: R94.5

## 2014-11-05 HISTORY — DX: Epigastric pain: R10.13

## 2014-11-05 HISTORY — DX: Nausea: R11.0

## 2014-11-05 HISTORY — DX: Fatty (change of) liver, not elsewhere classified: K76.0

## 2014-11-05 HISTORY — DX: Other specified abnormal findings of blood chemistry: R79.89

## 2014-11-05 HISTORY — DX: Other chronic pain: G89.29

## 2014-11-05 LAB — CBC WITH DIFFERENTIAL/PLATELET
Basophils Absolute: 0 10*3/uL (ref 0.0–0.1)
Basophils Relative: 0 % (ref 0–1)
Eosinophils Absolute: 0.1 10*3/uL (ref 0.0–0.7)
Eosinophils Relative: 1 % (ref 0–5)
HEMATOCRIT: 49.1 % (ref 39.0–52.0)
HEMOGLOBIN: 17.1 g/dL — AB (ref 13.0–17.0)
Lymphocytes Relative: 37 % (ref 12–46)
Lymphs Abs: 2.8 10*3/uL (ref 0.7–4.0)
MCH: 31.1 pg (ref 26.0–34.0)
MCHC: 34.8 g/dL (ref 30.0–36.0)
MCV: 89.3 fL (ref 78.0–100.0)
MONO ABS: 0.8 10*3/uL (ref 0.1–1.0)
MONOS PCT: 10 % (ref 3–12)
NEUTROS ABS: 3.9 10*3/uL (ref 1.7–7.7)
Neutrophils Relative %: 52 % (ref 43–77)
Platelets: 197 10*3/uL (ref 150–400)
RBC: 5.5 MIL/uL (ref 4.22–5.81)
RDW: 12.9 % (ref 11.5–15.5)
WBC: 7.5 10*3/uL (ref 4.0–10.5)

## 2014-11-05 LAB — COMPREHENSIVE METABOLIC PANEL
ALBUMIN: 3.7 g/dL (ref 3.5–5.2)
ALK PHOS: 103 U/L (ref 39–117)
ALT: 202 U/L — AB (ref 0–53)
AST: 149 U/L — AB (ref 0–37)
Anion gap: 11 (ref 5–15)
BILIRUBIN TOTAL: 0.4 mg/dL (ref 0.3–1.2)
BUN: 5 mg/dL — ABNORMAL LOW (ref 6–23)
CO2: 28 mEq/L (ref 19–32)
Calcium: 9.4 mg/dL (ref 8.4–10.5)
Chloride: 101 mEq/L (ref 96–112)
Creatinine, Ser: 0.81 mg/dL (ref 0.50–1.35)
GFR calc Af Amer: >90 mL/min (ref >90)
GFR calc non Af Amer: >90 mL/min (ref >90)
Glucose, Bld: 99 mg/dL (ref 70–99)
Potassium: 4.3 mEq/L (ref 3.7–5.3)
SODIUM: 140 meq/L (ref 137–147)
Total Protein: 8.1 g/dL (ref 6.0–8.3)

## 2014-11-05 LAB — LIPASE, BLOOD: Lipase: 26 U/L (ref 11–59)

## 2014-11-05 MED ORDER — ONDANSETRON 4 MG PO TBDP
8.0000 mg | ORAL_TABLET | Freq: Once | ORAL | Status: AC
  Administered 2014-11-05: 8 mg via ORAL
  Filled 2014-11-05: qty 2

## 2014-11-05 MED ORDER — ONDANSETRON HCL 4 MG PO TABS: 4.0000 mg | ORAL_TABLET | Freq: Three times a day (TID) | ORAL | Status: DC | PRN

## 2014-11-05 MED ORDER — FAMOTIDINE 20 MG PO TABS
40.0000 mg | ORAL_TABLET | Freq: Once | ORAL | Status: AC
  Administered 2014-11-05: 40 mg via ORAL
  Filled 2014-11-05: qty 2

## 2014-11-05 MED ORDER — GI COCKTAIL ~~LOC~~
30.0000 mL | Freq: Once | ORAL | Status: AC
  Administered 2014-11-05: 30 mL via ORAL
  Filled 2014-11-05: qty 30

## 2014-11-05 MED ORDER — FAMOTIDINE 20 MG PO TABS: 20.0000 mg | ORAL_TABLET | Freq: Two times a day (BID) | ORAL | Status: DC

## 2014-11-05 MED ORDER — PANTOPRAZOLE SODIUM 40 MG PO TBEC: 40.0000 mg | DELAYED_RELEASE_TABLET | Freq: Every day | ORAL | Status: DC

## 2014-11-05 NOTE — ED Notes (Signed)
Dr. McManus at bedside. 

## 2014-11-05 NOTE — ED Provider Notes (Signed)
CSN: [id number]     Arrival date & time 11/05/14  0732 History   First MD Initiated Contact with Patient 11/05/14 0759     Chief Complaint  Patient presents with  . Emesis      HPI Pt was seen at 0810. Per pt, c/o gradual onset and resolution of one episode of N/V that occurred this morning PTA. Pt endorses hx of chronic daily nausea with intermittent vomiting for the past several years. Has been associated with LUQ abd "pain." Pt was previously been evaluated by GI MD for same, but cannot recall the dx. States he has not f/u with GI MI nor taken his GI meds "in a while."  Denies diarrhea, no CP/SOB, no back pain, no fevers, no black or blood in stools or emesis.     GI: Dr Rhea Belton [facility]) Past Medical History  Diagnosis Date  . H. pylori infection 10/2013  . Gastroenteritis   . Allergy   . Chronic nausea     with intermittent vomiting  . Chronic epigastric pain since 2010  . Elevated LFTs   . Hepatic steatosis    Past Surgical History  Procedure Laterality Date  . Nasal sinus surgery     Family History  Problem Relation Age of Onset  . Breast cancer      great aunt  . Diabetes Paternal Uncle   . Diabetes Maternal Grandmother   . Kidney disease Mother    History  Substance Use Topics  . Smoking status: Never Smoker   . Smokeless tobacco: Never Used  . Alcohol Use: No    Review of Systems ROS: Statement: All systems negative except as marked or noted in the HPI; Constitutional: Negative for fever and chills. ; ; Eyes: Negative for eye pain, redness and discharge. ; ; ENMT: Negative for ear pain, hoarseness, nasal congestion, sinus pressure and sore throat. ; ; Cardiovascular: Negative for chest pain, palpitations, diaphoresis, dyspnea and peripheral edema. ; ; Respiratory: Negative for cough, wheezing and stridor. ; ; Gastrointestinal: +abd pain, N/V. Negative for diarrhea, blood in stool, hematemesis, jaundice and rectal bleeding. . ; ; Genitourinary: Negative for  dysuria, flank pain and hematuria. ; ; Musculoskeletal: Negative for back pain and neck pain. Negative for swelling and trauma.; ; Skin: Negative for pruritus, rash, abrasions, blisters, bruising and skin lesion.; ; Neuro: Negative for headache, lightheadedness and neck stiffness. Negative for weakness, altered level of consciousness , altered mental status, extremity weakness, paresthesias, involuntary movement, seizure and syncope.      Allergies  Penicillins  Home Medications   Prior to Admission medications   Medication Sig Start Date End Date Taking? Authorizing Provider  famotidine (PEPCID) 20 MG tablet Take 1 tablet (20 mg total) by mouth 2 (two) times daily. Patient not taking: Reported on 11/05/2014 06/12/14   Amy S Esterwood, PA-C  pantoprazole (PROTONIX) 40 MG tablet Take 1 tablet (40 mg total) by mouth daily. Patient not taking: Reported on 11/05/2014 06/18/14   Beverley Fiedler, MD   BP 121/71 mmHg  Pulse 66  Temp(Src) 98.3 F (36.8 C) (Oral)  Resp 20  Ht 5\' 5"  (1.651 m)  Wt 220 lb (99.791 kg)  BMI 36.61 kg/m2  SpO2 95% Physical Exam  0815: Physical examination:  Nursing notes reviewed; Vital signs and O2 SAT reviewed;  Constitutional: Well developed, Well nourished, Well hydrated, In no acute distress; Head:  Normocephalic, atraumatic; Eyes: EOMI, PERRL, No scleral icterus; ENMT: Mouth and pharynx normal, Mucous membranes moist; Neck: Supple,  Full range of motion, No lymphadenopathy; Cardiovascular: Regular rate and rhythm, No murmur, rub, or gallop; Respiratory: Breath sounds clear & equal bilaterally, No rales, rhonchi, wheezes.  Speaking full sentences with ease, Normal respiratory effort/excursion; Chest: Nontender, Movement normal; Abdomen: Soft, +LUQ tenderness to palp. No rebound or guarding. Nondistended, Normal bowel sounds; Genitourinary: No CVA tenderness; Extremities: Pulses normal, No tenderness, No edema, No calf edema or asymmetry.; Neuro: AA&Ox3, Major CN grossly  intact.  Speech clear. No gross focal motor or sensory deficits in extremities.; Skin: Color normal, Warm, Dry.   ED Course  Procedures     MDM  MDM Reviewed: previous chart, nursing note and vitals Reviewed previous: labs and ultrasound Interpretation: labs      Results for orders placed or performed during the hospital encounter of 11/05/14  Comprehensive metabolic panel  Result Value Ref Range   Sodium 140 137 - 147 mEq/L   Potassium 4.3 3.7 - 5.3 mEq/L   Chloride 101 96 - 112 mEq/L   CO2 28 19 - 32 mEq/L   Glucose, Bld 99 70 - 99 mg/dL   BUN 5 (L) 6 - 23 mg/dL   Creatinine, Ser 1.610.81 0.50 - 1.35 mg/dL   Calcium 9.4 8.4 - 09.610.5 mg/dL   Total Protein 8.1 6.0 - 8.3 g/dL   Albumin 3.7 3.5 - 5.2 g/dL   AST 045149 (H) 0 - 37 U/L   ALT 202 (H) 0 - 53 U/L   Alkaline Phosphatase 103 39 - 117 U/L   Total Bilirubin 0.4 0.3 - 1.2 mg/dL   GFR calc non Af Amer >90 >90 mL/min   GFR calc Af Amer >90 >90 mL/min   Anion gap 11 5 - 15  Lipase, blood  Result Value Ref Range   Lipase 26 11 - 59 U/L  CBC with Differential  Result Value Ref Range   WBC 7.5 4.0 - 10.5 K/uL   RBC 5.50 4.22 - 5.81 MIL/uL   Hemoglobin 17.1 (H) 13.0 - 17.0 g/dL   HCT 40.949.1 81.139.0 - 91.452.0 %   MCV 89.3 78.0 - 100.0 fL   MCH 31.1 26.0 - 34.0 pg   MCHC 34.8 30.0 - 36.0 g/dL   RDW 78.212.9 95.611.5 - 21.315.5 %   Platelets 197 150 - 400 K/uL   Neutrophils Relative % 52 43 - 77 %   Neutro Abs 3.9 1.7 - 7.7 K/uL   Lymphocytes Relative 37 12 - 46 %   Lymphs Abs 2.8 0.7 - 4.0 K/uL   Monocytes Relative 10 3 - 12 %   Monocytes Absolute 0.8 0.1 - 1.0 K/uL   Eosinophils Relative 1 0 - 5 %   Eosinophils Absolute 0.1 0.0 - 0.7 K/uL   Basophils Relative 0 0 - 1 %   Basophils Absolute 0.0 0.0 - 0.1 K/uL   [place] Abdomen Complete 11/05/2014   CLINICAL DATA:  28 year old with abdominal pain and elevated LFTs.  EXAM: ULTRASOUND ABDOMEN COMPLETE  COMPARISON:  06/14/2014  FINDINGS: Gallbladder: No gallstones or wall thickening visualized.  No sonographic Murphy sign noted.  Common bile duct: Diameter: 0.4 cm  Liver: Liver parenchyma is echogenic with poor visualization of the internal architecture. Findings are suggestive for hepatic steatosis. There is a hypoechoic area adjacent to the gallbladder. Suspect this represents focal fat sparing. This area roughly measures up to 1.5 cm.  IVC: Limited evaluation.  Pancreas: Visualized portion unremarkable.  Limited evaluation.  Spleen: Size and appearance within normal limits.  Right Kidney: Length: 10.6 cm.  Echogenicity within normal limits. No mass or hydronephrosis visualized.  Left Kidney: Length: 11.0 cm. Echogenicity within normal limits. No mass or hydronephrosis visualized.  Abdominal aorta: No aneurysm visualized.  Other findings: None.  IMPRESSION: The liver parenchyma is diffusely echogenic and most compatible with hepatic steatosis. There is a focal hypoechoic area near the gallbladder. This hypoechoic area is most suggestive for focal fat sparing.  Normal appearance of the gallbladder. Negative for stones or biliary dilatation.   Electronically Signed   By: Richarda OverlieAdam  Henn M.D.   On: 11/05/2014 11:06    1120:  LFT's elevated today but US reassuring and pt denies etoh or APAP intake. Pt has tol PO well while in the ED without N/V.  No stooling while in the ED.  Abd benign, VSS. Feels better and wants to go home now. Strongly encouraged to continue to take his GI meds and f/u with GI MD for good continuity of care and control of his chronic symptoms. Pt verb understanding. Dx and testing d/w pt.  Questions answered.  Verb understanding, agreeable to d/c home with outpt f/u.    Samuel JesterKathleen Sheryl Towell, DO 11/08/14 1350

## 2014-11-05 NOTE — ED Notes (Signed)
Patient coming from home with 1 episode of vomiting that started this morning.  Patient is not actively vomiting in the ED.

## 2014-11-05 NOTE — Discharge Planning (Signed)
[facility]  [facility] Patient was instructed to follow up with his pcp once discharged. Patient expressed no concerns with his orange card or pcp at this time. My contact information provided for any future questions or concerns. No other needs identified at this time.

## 2014-11-05 NOTE — ED Notes (Signed)
Patient updated on the plan of care to have an US Abdomen.

## 2014-11-05 NOTE — ED Notes (Signed)
Gave pt a ginger ale and some saltines with MD Clarene DukeMcManus approval. Pt tolerating well at this time.

## 2014-11-05 NOTE — Discharge Instructions (Signed)
°Emergency Department Resource Guide °1) Find a Doctor and Pay Out of Pocket °Although you won't have to find out who is covered by your insurance plan, it is a good idea to ask around and get recommendations. You will then need to call the office and see if the doctor you have chosen will accept you as a new patient and what types of options they offer for patients who are self-pay. Some doctors offer discounts or will set up payment plans for their patients who do not have insurance, but you will need to ask so you aren't surprised when you get to your appointment. ° °2) Contact Your Local Health Department °Not all health departments have doctors that can see patients for sick visits, but many do, so it is worth a call to see if yours does. If you don't know where your local health department is, you can check in your phone book. The CDC also has a tool to help you locate your state's health department, and many state websites also have listings of all of their local health departments. ° °3) Find a Walk-in Clinic °If your illness is not likely to be very severe or complicated, you may want to try a walk in clinic. These are popping up all over the country in pharmacies, drugstores, and shopping centers. They're usually staffed by nurse practitioners or physician assistants that have been trained to treat common illnesses and complaints. They're usually fairly quick and inexpensive. However, if you have serious medical issues or chronic medical problems, these are probably not your best option. ° °No Primary Care Doctor: °- Call [facility] at  832-8000 - they can help you locate a primary care doctor that  accepts your insurance, provides certain services, etc. °- Physician Referral Service- 1-800-533-3463 ° °Chronic Pain Problems: °Organization         Address  Phone   Notes  °[facility]  (336) 297-2271 Patients need to be referred by their primary care doctor.  ° °Medication  Assistance: °Organization         Address  Phone   Notes  °[place] [facility] [address] °[place], [place] [address] (336) 641-8030 --Must be a resident of [place] °-- Must have NO insurance coverage whatsoever (no Medicaid/ Medicare, etc.) °-- The pt. MUST have a primary care doctor that directs their care regularly and follows them in the community °  °[facility]  (866) 331-1348   °[employer]  (888) 892-1162   ° °Agencies that provide inexpensive medical care: °Organization         Address  Phone   Notes  °[facility]  (336) 832-8035   °[facility]    (336) 832-7272   °[facility] [address] °[place], [place] [address] (336) 832-4777   °[facility] [address], °[place] (336) 271-4999   °[facility]    (336) 373-0678   °[facility]    (336) 272-1050   °[facility] ° [address], [place] Phone:  (336) 832-4444, Fax:  (336) 832-4440 Hours of Operation:  9 am - 6 pm, M-F.  Also accepts Medicaid/Medicare and self-pay.  °[facility] ° [address], [place] Phone: (336) 832-3150, Fax: (336) [phone]. Hours of Operation:  8:30 am - 5:30 pm, M-F.  Also accepts Medicaid and self-pay.  °[facility] [place] [address], [place] Phone: (336) 878-6027   °[facility] [address], [place], [place] (336)723-1848, Ext. 123 Mondays & Thursdays: 7-9 AM.  First 15 patients are seen on a first come, first serve basis. °  ° °Medicaid-accepting [place] Providers: ° °Organization         Address  Phone   Notes  °[facility] [address], [place] (336) 641-2100 Also accepts self-pay patients.  °[facility] [address], [place] ° (336) 856-9996   °[facility] [address], [place]  (336) 288-8857   °[facility] [address] [place] [address], [place] (336) 299-7000   °Veita Bland [address], [place]  ° (336) 373-1557 Only accepts [place] Access Medicaid patients after they have their name applied to their card.  ° °Self-Pay (no insurance) in [place]: ° °Organization         Address  Phone   Notes  °Sickle Cell Patients, [facility] [address], [place] (336) 832-1970   °[facility] [address], [place] (336) 832-4400   °[facility] ° [address], [place] (336) 992-4800   °[facility]/Dr. Osei-Bonsu ° [address] [place] [address], [place] or [address], [place] (336) 841-8500 Phone number for both [place] and [place] locations is the same.  °[facility] [address], [place] (336) 299-0000   °[facility] [address] [place] [address], [place] or [address] (336) 852-7530 °(336) 878-2260   °[facility] [address], [place] (336) 350-1642, phone; (336) 294-5005, fax Sees patients 1st and 3rd Saturday of every month.  Must not qualify for public or private insurance (i.e. Medicaid, Medicare, [place] Health Choice, Veterans' Benefits) • Household income should be no more than 200% of the poverty level •The clinic cannot treat you if you are pregnant or think you are pregnant • Sexually transmitted diseases are not treated at the clinic.  ° ° °Dental Care: °Organization         Address  Phone  Notes  °[place] [facility] [address], [place] (336) 641-6152 Accepts children up to age 21 who are enrolled in Medicaid or [place] Health Choice; pregnant women with a Medicaid card; and children who have applied for Medicaid or [place] Health Choice, but were declined, whose parents can pay a reduced fee at time of service.  °[place]  [facility] [place]  [address], [place] (336) 641-7733 Accepts children up to age 21 who are enrolled in Medicaid or [place] Health Choice; pregnant women with a Medicaid card; and children who have applied for Medicaid or [place] Health Choice, but were declined, whose parents can pay a reduced fee at time of service.  °[facility] ° [address], [place] (336) 641-4533 Patients are seen by appointment only. Walk-ins are not accepted. [facility] will see patients 18 years of age and older. °Monday - Tuesday (8am-5pm) °Most Wednesdays (8:30-5pm) °$30 per visit, cash only  °[facility] ° [address], [place] (336) 641-4533 Patients are seen by appointment only. Walk-ins are not accepted. [facility] will see patients 18 years of age and older. °One   Wednesday Evening (Monthly: Volunteer Based).  $30 per visit, cash only  °[facility]  (919) 537-3737 for adults; Children under age 4, call Graduate Pediatric Dentistry at ([phone]) [phone]. Children aged 4-14, please call (919) [phone] to request a pediatric application. ° Dental services are provided in all areas of dental care including fillings, crowns and bridges, complete and partial dentures, implants, gum treatment, root canals, and extractions. Preventive care is also provided. Treatment is provided to both adults and children. °Patients are selected via a lottery and there is often a waiting list. °  °[facility] [address], °[place] ° (336) 763-8833 [email or web address] °  °[facility] [address], [place], [place] (336)723-1848, Ext. 123 Second and Fourth Thursday of each month, opens at 6:30 AM; Clinic ends at 9 AM.  Patients are seen on a first-come first-served basis, and a limited number are seen during each clinic.  ° °[facility] ° [address], [place], [place] (336) 723-7904    Eligibility Requirements °You must have lived in [place], [place], or [place] counties for at least the last three months. °  You cannot be eligible for state or federal sponsored healthcare insurance, including Veterans Administration, Medicaid, or Medicare. °  You generally cannot be eligible for healthcare insurance through your employer.  °  How to apply: °Eligibility screenings are held every Tuesday and Wednesday afternoon from 1:00 pm until 4:00 pm. You do not need an appointment for the interview!  °[facility] [address], [place], [place] 336-631-2330   °[facility]  336-342-8273   °[facility]  336-703-3100   °[facility]  336-570-6415   ° °Behavioral Health Resources in the Community: °Intensive Outpatient Programs °Organization         Address  Phone  Notes  °[facility] [address], [place], [place] 336-878-6098   °[facility] [address], [place], [place] 336-832-9800   °[facility] [address], [place], [place] ° 336-882-2125   °[facility] [address],  °[place], [place] 1-800-853-5163 or 336-641-4981   °Substance Abuse Resources °Organization         Address  Phone  Notes  °[facility]  336-882-2125   °[facility]  336-784-9470   °[facility]  336-285-9073   °[facility]  336-845-3988   °Residential & Outpatient Substance Abuse Program  1-800-659-3381   °Psychological Services °Organization         Address  Phone  Notes  °[facility]  336- 832-9600   °[facility]  336- 378-7881   °[facility] [address], [place] 1-800-853-5163 or 336-641-4981   ° °Mobile Crisis Teams °Organization         Address  Phone  Notes  °[facility], Mobile Crisis Care Unit  1-877-626-1772   °Assertive °[facility] ° [address]  [place], [place] 336-834-9664   °Sharon DeEsch [address] °[place] 336-554-5454   ° °Self-Help/Support Groups °Organization         Address  Phone             Notes  °[facility]  - variety of support groups  336- 373-1402 Call for more information  °Narcotics Anonymous (NA), [facility] [address], °[place]  2 meetings at this location  ° °  Residential Treatment Programs Organization         Address  Phone  Notes  [facility] [address],    [place]  [phone]   [facility]  [address], [place] [id number], [place], [place] [phone]   [facility] [address], [place] [phone] Admissions: 8am-3pm M-F  [facility] [address]    [place], [place] [phone]   [facility] [address], [place], [place] [phone]   [facility] [address]  [place], [place] [phone]   [facility] Intensive Outpatient [address], [place], [place] [phone]   [facility] [address]  [place], [place] [phone] or [phone]   [facility] ([facility]) [address] [place], [place] [phone] Accepts Medicaid  [facility] [address]  [place] [phone] Substance Abuse/Addiction Treatment   [facility] Resources Organization         Address  Phone  Notes  [facility]  [phone](888) 303-022-6977   Angie FavaJulie Brannon, PhD [address], [address] [place], [place]   [phone](336) [phone] or [phone](336) [phone]   [facility]   [address] [place], [place] [phone](336) [phone]   [facility] [address], [place], [place] ([phone](336) [phone] Insurance/Medicaid/sponsorship through [facility]  [facility] [address]                                    [place], [place] [phone](336) [phone] Therapy/tele-psych/case    [facility] [address]   [place], [place] [phone](336) [phone]    Dr. Lolly MustacheArfeen  ([phone](336) [phone]   [facility] [place]  [employer] [facility] 1) [address], [place] 2) [address], [place] 3)  [address], [place] [phone](336) [phone](336) [phone]  [phone](336) [phone]   [facility] Child Abuse Hotline (201)022-3034(336) 850-228-8451 or 5087202767(336) [phone] (After Hours)      Eat a bland diet, avoiding greasy, fatty, fried foods, as well as spicy and acidic foods or beverages.  Avoid eating within the hour or 2 before going to bed or laying down.  Also avoid teas, colas, coffee, chocolate, pepermint and spearment.  May also take over the counter maalox/mylanta, as directed on packaging, as needed for discomfort.  Take the prescriptions as directed.  Call your regular medical doctor and your GI doctor today to schedule a follow up appointment this week.  Return to the Emergency Department immediately if worsening.

## 2014-11-13 ENCOUNTER — Ambulatory Visit: Payer: No Typology Code available for payment source | Admitting: Internal Medicine

## 2014-12-27 ENCOUNTER — Other Ambulatory Visit (INDEPENDENT_AMBULATORY_CARE_PROVIDER_SITE_OTHER): Payer: No Typology Code available for payment source

## 2014-12-27 ENCOUNTER — Ambulatory Visit (INDEPENDENT_AMBULATORY_CARE_PROVIDER_SITE_OTHER): Payer: No Typology Code available for payment source | Admitting: Internal Medicine

## 2014-12-27 ENCOUNTER — Encounter: Payer: Self-pay | Admitting: Internal Medicine

## 2014-12-27 VITALS — BP 124/74 | HR 92 | Ht 65.5 in | Wt 216.5 lb

## 2014-12-27 DIAGNOSIS — R748 Abnormal levels of other serum enzymes: Secondary | ICD-10-CM

## 2014-12-27 DIAGNOSIS — R112 Nausea with vomiting, unspecified: Secondary | ICD-10-CM

## 2014-12-27 LAB — HEPATIC FUNCTION PANEL
ALK PHOS: 96 U/L (ref 39–117)
ALT: 183 U/L — AB (ref 0–53)
AST: 107 U/L — AB (ref 0–37)
Albumin: 4.2 g/dL (ref 3.5–5.2)
BILIRUBIN TOTAL: 0.4 mg/dL (ref 0.2–1.2)
Bilirubin, Direct: 0.1 mg/dL (ref 0.0–0.3)
Total Protein: 8.1 g/dL (ref 6.0–8.3)

## 2014-12-27 MED ORDER — ONDANSETRON HCL 4 MG PO TABS: 4.0000 mg | ORAL_TABLET | Freq: Three times a day (TID) | ORAL | Status: DC | PRN

## 2014-12-27 MED ORDER — PANTOPRAZOLE SODIUM 40 MG PO TBEC: 40.0000 mg | DELAYED_RELEASE_TABLET | Freq: Every day | ORAL | Status: DC

## 2014-12-27 NOTE — Progress Notes (Signed)
Subjective:    Patient ID: [name] Christensen, male    DOB: [birth year], 29 y.o.   MRN: [id number]  HPI [name] SlatesRamon Rossmann is a 29 year old male with a past medical history of H. pylori status post treatment, elevated liver enzymes who is seen for follow-up. He reports he's continued to have on and off problems with nausea and vomiting occasional epigastric discomfort. He is no longer taking pantoprazole because he ran out of this medication. He has been out approximate one month. He reports a normal appetite but postprandial pain on occasion. This is not consistent. He endorses low energy and fatigue. He is taking no oral medications or over-the-counter supplements at this time. He reports normal bowel movements with no blood in his stool or melena. He does occasionally have nausea and vomiting which seems to be somewhat random. He has having headaches without visual changes. He tried Advil with no relief. He denies frequent alcohol use and over-the-counter supplements. He feels he has lost approximately 10 pounds though according to our scales he was 213 pounds in June 2015 and 216 pounds today.   Last upper endoscopy was performed on 06/18/2014 which revealed a normal esophagus, food residue in the gastric body and fundus which precluded complete visualization. This raised the question of gastroparesis. Gastric biopsies were negative for H. pylori. There was mild chronic inflammation. No metaplasia. The examined duodenum was normal.   Review of Systems  as per history of present illness, otherwise negative  Current Medications, Allergies, Past Medical History, Past Surgical History, Family History and Social History were reviewed in [facility] electronic medical record.     Objective:   Physical Exam BP 124/74 mmHg  Pulse 92  Ht 5' 5.5" (1.664 m)  Wt 216 lb 8 oz (98.204 kg)  BMI 35.47 kg/m2 Constitutional: Well-developed and well-nourished. No distress. HEENT: Normocephalic and atraumatic.  Oropharynx is clear and moist. No oropharyngeal exudate. Conjunctivae are normal.  No scleral icterus. Neck: Neck supple. Trachea midline. Cardiovascular: Normal rate, regular rhythm and intact distal pulses. No M/R/G Pulmonary/chest: Effort normal and breath sounds normal. No wheezing, rales or rhonchi. Abdominal: Soft, nontender, nondistended. Bowel sounds active throughout. There are no masses palpable. No hepatosplenomegaly. Extremities: no clubbing, cyanosis, or edema Lymphadenopathy: No cervical adenopathy noted. Neurological: Alert and oriented to person place and time. Skin: Skin is warm and dry. No rashes noted. Psychiatric: Normal mood and affect. Behavior is normal.  CBC    Component Value Date/Time   WBC 7.5 11/05/2014 0822   RBC 5.50 11/05/2014 0822   HGB 17.1* 11/05/2014 0822   HCT 49.1 11/05/2014 0822   PLT 197 11/05/2014 0822   MCV 89.3 11/05/2014 0822   MCH 31.1 11/05/2014 0822   MCHC 34.8 11/05/2014 0822   RDW 12.9 11/05/2014 0822   LYMPHSABS 2.8 11/05/2014 0822   MONOABS 0.8 11/05/2014 0822   EOSABS 0.1 11/05/2014 0822   BASOSABS 0.0 11/05/2014 0822    CMP     Component Value Date/Time   NA 140 11/05/2014 0822   K 4.3 11/05/2014 0822   CL 101 11/05/2014 0822   CO2 28 11/05/2014 0822   GLUCOSE 99 11/05/2014 0822   BUN 5* 11/05/2014 0822   CREATININE 0.81 11/05/2014 0822   CREATININE 0.87 02/21/2014 1205   CALCIUM 9.4 11/05/2014 0822   PROT 8.1 12/27/2014 1602   ALBUMIN 4.2 12/27/2014 1602   AST 107* 12/27/2014 1602   ALT 183* 12/27/2014 1602   ALKPHOS 96 12/27/2014 1602   BILITOT 0.4  12/27/2014 1602   GFRNONAA >90 11/05/2014 0822   GFRNONAA >89 02/21/2014 1205   GFRAA >90 11/05/2014 0822   GFRAA >89 02/21/2014 1205   Lab Results  Component Value Date   ANA NEG 06/18/2014   IGG normal Hep B and C negative (Hep B surf Ab + indicating prior vaccination) GGT elevated Lab Results  Component Value Date   FERRITIN 65.8 06/21/2014         Assessment & Plan:  29 year old male with a past medical history of H. pylori status post treatment, elevated liver enzymes who is seen for follow-up.  1. N/V/epigastric pain -- symptoms are intermittent though he had retained food at the time of his upper endoscopy on 06/18/2014. This is suspicious for gastroparesis. I would like to perform a formal gastric empty study for definitive diagnosis. I will restart pantoprazole 40 mg daily in prescribe Zofran 4 mg every 6-8 hours as needed for nausea. Gastroparesis diet recommended  2. Elevated liver enzymes -- unclear etiology but persistent. Enzymes in the past and been slightly higher than one would expect for fatty liver disease alone though evaluation was negative for other causes of liver inflammation such as viral hepatitis, autoimmune hepatitis and iron overload. He denies excessive alcohol use and over-the-counter supplements. Fatty liver is the most likely, though biopsy has not been performed. We will repeat liver enzymes today along with repeat ANA and anti-smooth muscle antibody and ceruloplasmin. If no clear etiology will consider referral to [facility].  Return in 2-3 months, sooner if needed

## 2014-12-27 NOTE — Patient Instructions (Addendum)
Your physician has requested that you go to the basement for the lab work before leaving today.  We have sent the following medications to your pharmacy for you to pick up at your convenience: Zofran, Pantoprazole  Please follow up in 3 months  You have been scheduled [facility] 01[facility]15 at 7:30am. Please arrive at least 15 minutes prior to your appointment for registration. Please make certain not to have anything to eat or drink after midnight the night before your test. Hold all stomach medications (ex: Zofran, phenergan, Reglan) 48 hours prior to your test. If you need to reschedule your appointment, please contact radiology scheduling at [phone]. _____________________________________________________________________ A gastric-emptying study measures how long it takes for food to move through your stomach. There are several ways to measure stomach emptying. In the most common test, you eat food that contains a small amount of radioactive material. A scanner that detects the movement of the radioactive material is placed over your abdomen to monitor the rate at which food leaves your stomach. This test normally takes about 2 hours to complete. _____________________________________________________________________  Please contact your primary care provider Dr. Orpah CobbAdvani to reschedule your appointment to discuss your headaches.  Cc: Advani

## 2014-12-28 LAB — ANA: ANA: NEGATIVE

## 2014-12-28 LAB — ANTI-SMOOTH MUSCLE ANTIBODY, IGG: Smooth Muscle Ab: 5 U (ref <20)

## 2014-12-31 LAB — CERULOPLASMIN: Ceruloplasmin: 27 mg/dL (ref 18–36)

## 2015-01-07 ENCOUNTER — Ambulatory Visit (HOSPITAL_COMMUNITY)
Admission: RE | Admit: 2015-01-07 | Discharge: 2015-01-07 | Disposition: A | Discharge status: Home / Self Care | Payer: No Typology Code available for payment source | Source: Ambulatory Visit | Attending: Internal Medicine | Admitting: Internal Medicine

## 2015-01-07 DIAGNOSIS — R948 Abnormal results of function studies of other organs and systems: Secondary | ICD-10-CM | POA: Insufficient documentation

## 2015-01-07 DIAGNOSIS — R112 Nausea with vomiting, unspecified: Secondary | ICD-10-CM | POA: Insufficient documentation

## 2015-01-07 DIAGNOSIS — R634 Abnormal weight loss: Secondary | ICD-10-CM | POA: Insufficient documentation

## 2015-01-07 DIAGNOSIS — R748 Abnormal levels of other serum enzymes: Secondary | ICD-10-CM | POA: Insufficient documentation

## 2015-01-07 DIAGNOSIS — R140 Abdominal distension (gaseous): Secondary | ICD-10-CM | POA: Insufficient documentation

## 2015-01-07 MED ORDER — TECHNETIUM TC 99M SULFUR COLLOID
2.1000 | Freq: Once | INTRAVENOUS | Status: AC | PRN
Start: 2015-01-07 — End: 2015-01-07
  Administered 2015-01-07: 2.1 via INTRAVENOUS

## 2015-01-10 ENCOUNTER — Ambulatory Visit: Payer: No Typology Code available for payment source

## 2015-01-11 ENCOUNTER — Ambulatory Visit (INDEPENDENT_AMBULATORY_CARE_PROVIDER_SITE_OTHER): Payer: No Typology Code available for payment source | Admitting: Physician Assistant

## 2015-01-11 ENCOUNTER — Encounter: Payer: Self-pay | Admitting: Physician Assistant

## 2015-01-11 VITALS — BP 126/74 | HR 76 | Ht 65.5 in | Wt 221.0 lb

## 2015-01-11 DIAGNOSIS — K3184 Gastroparesis: Secondary | ICD-10-CM

## 2015-01-11 DIAGNOSIS — K760 Fatty (change of) liver, not elsewhere classified: Secondary | ICD-10-CM

## 2015-01-11 DIAGNOSIS — K219 Gastro-esophageal reflux disease without esophagitis: Secondary | ICD-10-CM

## 2015-01-11 MED ORDER — METOCLOPRAMIDE HCL 5 MG PO TABS: 5.0000 mg | ORAL_TABLET | Freq: Three times a day (TID) | ORAL | Status: DC

## 2015-01-11 NOTE — Progress Notes (Addendum)
Patient ID: [name] Christensen, male   DOB: [birth year], 29 y.o.   MRN: [id number]     History of Present Illness:   [name] Christensen is a 29 year old Hispanic male who was here for follow-up of gastroparesis and abnormal liver enzymes. He recently had a gastric emptying scan that revealed a delay in gastric emptying. He has also had an ultrasound that showed fatty liver. Recent repeat LFTs had slight improvement from those drawn 2 months ago. Patient is here today he continues to have some mild postprandial nausea with no vomiting. He says he feels a little bit better than he did at his last visit. He continues to use pantoprazole and famotidine.   Past Medical History  Diagnosis Date  . H. pylori infection 10/2013  . Gastroenteritis   . Allergy   . Chronic nausea     with intermittent vomiting  . Chronic epigastric pain since 2010  . Elevated LFTs   . Hepatic steatosis     Past Surgical History  Procedure Laterality Date  . Nasal sinus surgery     Family History  Problem Relation Age of Onset  . Breast cancer      great aunt  . Diabetes Paternal Uncle   . Diabetes Maternal Grandmother   . Kidney disease Mother    History  Substance Use Topics  . Smoking status: Never Smoker   . Smokeless tobacco: Never Used  . Alcohol Use: No   Current Outpatient Prescriptions  Medication Sig Dispense Refill  . famotidine (PEPCID) 20 MG tablet Take 1 tablet (20 mg total) by mouth 2 (two) times daily. 30 tablet 0  . ondansetron (ZOFRAN) 4 MG tablet Take 1 tablet (4 mg total) by mouth every 8 (eight) hours as needed for nausea or vomiting. 30 tablet 0  . pantoprazole (PROTONIX) 40 MG tablet Take 1 tablet (40 mg total) by mouth daily. 30 tablet 2  . metoCLOPramide (REGLAN) 5 MG tablet Take 1 tablet (5 mg total) by mouth 4 (four) times daily -  before meals and at bedtime. 120 tablet 3   No current facility-administered medications for this visit.   Allergies  Allergen Reactions  . Penicillins    Increased heart rate      Review of Systems: Gen: Denies any fever, chills, sweats, anorexia, fatigue, weakness, malaise, weight loss, and sleep disorder CV: Denies chest pain, angina, palpitations, syncope, orthopnea, PND, peripheral edema, and claudication. Resp: Denies dyspnea at rest, dyspnea with exercise, cough, sputum, wheezing, coughing up blood, and pleurisy. GI: Denies vomiting blood, jaundice, and fecal incontinence.   Denies dysphagia or odynophagia. GU : Denies urinary burning, blood in urine, urinary frequency, urinary hesitancy, nocturnal urination, and urinary incontinence. MS: Denies joint pain, limitation of movement, and swelling, stiffness, low back pain, extremity pain. Denies muscle weakness, cramps, atrophy.  Derm: Denies rash, itching, dry skin, hives, moles, warts, or unhealing ulcers.  Psych: Denies depression, anxiety, memory loss, suicidal ideation, hallucinations, paranoia, and confusion. Heme: Denies bruising, bleeding, and enlarged lymph nodes. Neuro:  Denies any headaches, dizziness, paresthesia Endo:  Denies any problems with DM, thyroid, adrenal  LAB RESULTS:  Hepatic function panel from 12/27/2014 had total bili 0.4, direct bili 0.1, alkaline phosphatase 96, AST 107, ALT 183, total protein 8.1, albumin 4.2.  Studies:   Nm Gastric Emptying  01/07/2015   CLINICAL DATA:  Nausea, vomiting, bloating, weight loss  EXAM: NUCLEAR MEDICINE GASTRIC EMPTYING SCAN  TECHNIQUE: After oral ingestion of radiolabeled meal, sequential abdominal images were obtained for 120  minutes. Residual percentage of activity remaining within the stomach was calculated at 60 and 120 minutes.  RADIOPHARMACEUTICALS:  2.1 Technetium 99-m labeled sulfur colloid  COMPARISON:  None.  FINDINGS: Expected location of the stomach in the left upper quadrant. Ingested meal empties the stomach gradually over the course of the study with 92% retention at 60 min and 59% retention at 120 min (normal  retention less than 30% at a 120 min).  IMPRESSION: Abnormal gastric emptying study with significant retention of gastric activity at 2 hr.   Electronically Signed   By: Elige KoHetal  Patel   On: 01/07/2015 10:40     Physical Exam: General: Pleasant, well developed male in no acute distress Head: Normocephalic and atraumatic Eyes:  sclerae anicteric, conjunctiva pink  Ears: Normal auditory acuity Lungs: Clear throughout to auscultation Heart: Regular rate and rhythm Abdomen: Soft, non distended, non-tender. No masses, no hepatomegaly. Normal bowel sounds Musculoskeletal: Symmetrical with no gross deformities  Extremities: No edema  Neurological: Alert oriented x 4, grossly nonfocal Psychological:  Alert and cooperative. Normal mood and affect  Assessment and Recommendations: #1. Gastroparesis. An extensive amount of time was spent with the patient explaining gastroparesis and how his etiology is not clear. He has been advised to continue pantoprazole 40 mg each morning along with famotidine 20 mg twice a day he will be given a trial of Reglan 5 mg before meals and at bedtime we have reviewed the possible side effect profile of Reglan at length. The patient has been instructed to call us immediately if he has any trouble tolerating the Reglan at which time it can be discontinued it was also explained to the patient that should the Reglan not be tolerated or not provide any relief, he may be a candidate for low-dose erythromycin or domperidone. Patient has been instructed to adhere to a gastroparesis diet with small-volume frequent low fat meals.  #2. Elevated liver enzymes. This is likely due to fatty liver. Patient has been instructed to use vitamin E8 100 international units daily. He has been advised to try to lose weight and to exercise. Patient is very concerned and somewhat confused over what he can and can't eat. He seems to have an Affinity for fried bread, cheesy foods, and would like more  explanation on better food choices. For this reason he will be scheduled to meet with a dietitian who can review a gastroparesis diet and no weight loss plan at length with the patient.  Patient has been advised to call us immediately if he has any trouble tolerating his Reglan. He will follow up in 3 months, sooner if needed.   [place]ale Sherburn, Moise BoringLori P PA-C 01/11/2015,  Addendum: Reviewed and agree with  Management and the excellent note as written by Gay FillerLori Lynard Postlewait, PA-C Office followup with me in 12 weeks Beverley FiedlerJay M Pyrtle, MD

## 2015-01-11 NOTE — Patient Instructions (Signed)
We have sent the following medications to your pharmacy for you to pick up at your convenience:  Reglan  Continue pantoprazole and famotidine.  You will be hearing from someone to schedule a dietary consult.  Please follow up with Dr. Rhea BeltonPyrtle on 04/08/2015 at 3:00pm

## 2015-02-06 ENCOUNTER — Telehealth: Payer: Self-pay | Admitting: Internal Medicine

## 2015-02-06 NOTE — Telephone Encounter (Signed)
Patient came into facility stating that his tooth has been hurting and would like to speak to a nurse. Patient was advised to go to Urgent Care.

## 2015-02-08 ENCOUNTER — Ambulatory Visit: Payer: No Typology Code available for payment source

## 2015-02-18 ENCOUNTER — Ambulatory Visit: Payer: No Typology Code available for payment source | Admitting: Dietician

## 2015-03-11 ENCOUNTER — Ambulatory Visit: Payer: No Typology Code available for payment source

## 2015-03-13 ENCOUNTER — Encounter: Payer: Self-pay | Admitting: *Deleted

## 2015-04-08 ENCOUNTER — Ambulatory Visit: Payer: No Typology Code available for payment source | Admitting: Internal Medicine

## 2016-06-30 IMAGING — US US ABDOMEN COMPLETE
1 series · 14 of 25 positions shown · non-contrast
Comparison: None.

CLINICAL DATA: Abdominal pain with nausea and vomiting.

EXAM:
ULTRASOUND ABDOMEN COMPLETE

[Series 1: us abdomen complete · 0.28mm/px · 14 of 87 slices shown]
[im 1/87]
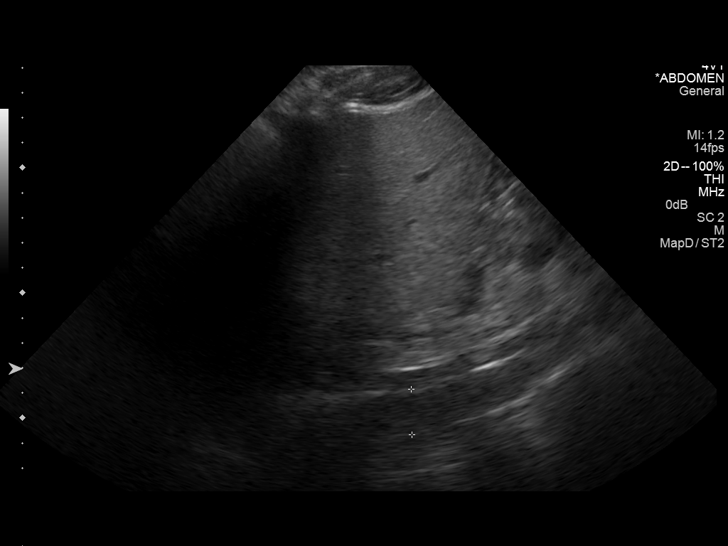
[im 8/87]
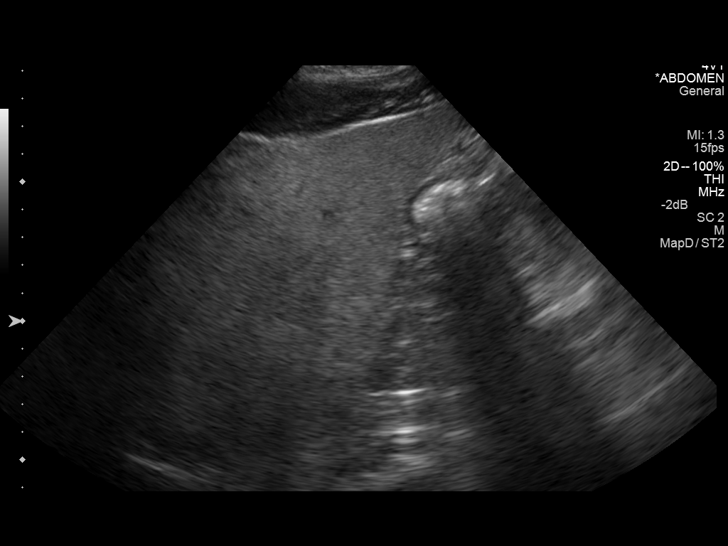
[im 15/87]
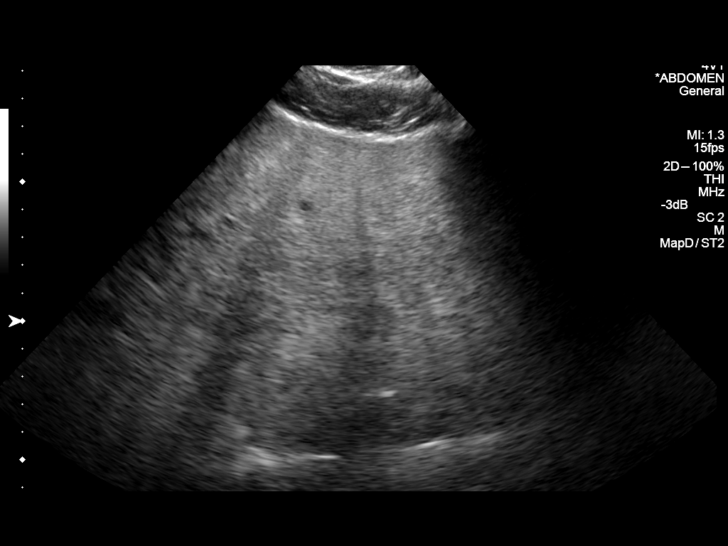
[im 22/87]
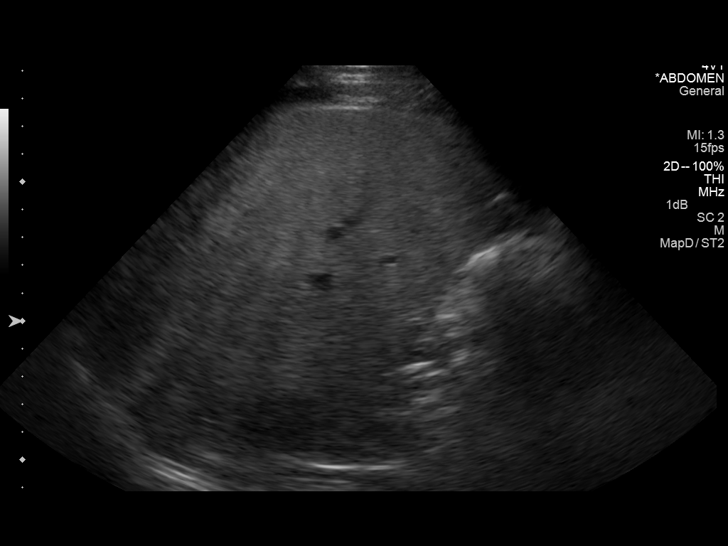
[im 29/87]
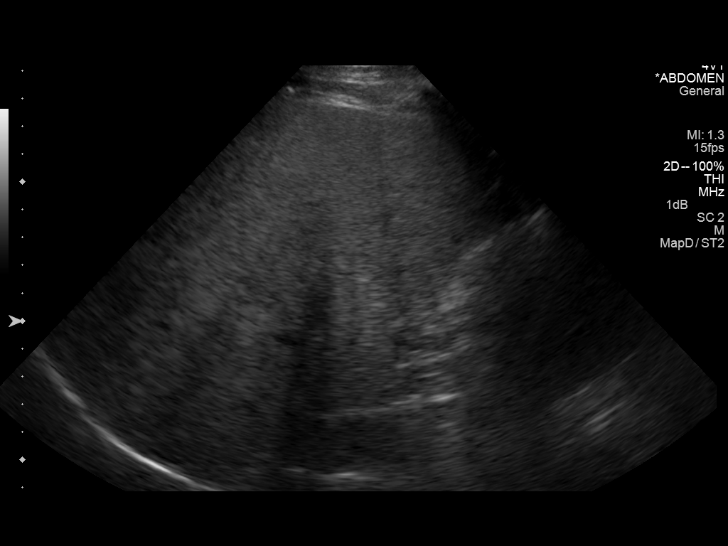
[im 33/87]
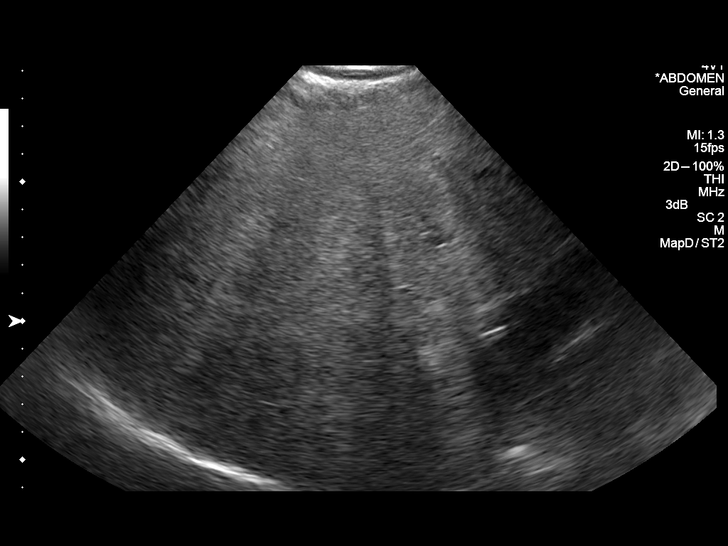
[im 40/87]
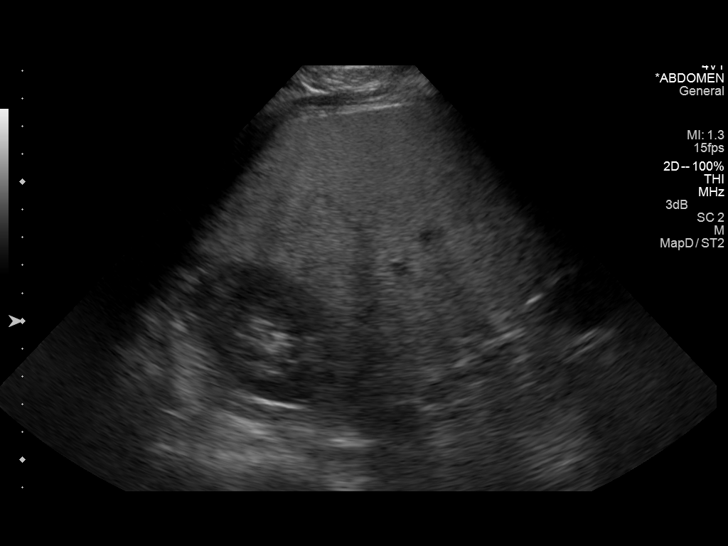
[im 47/87]
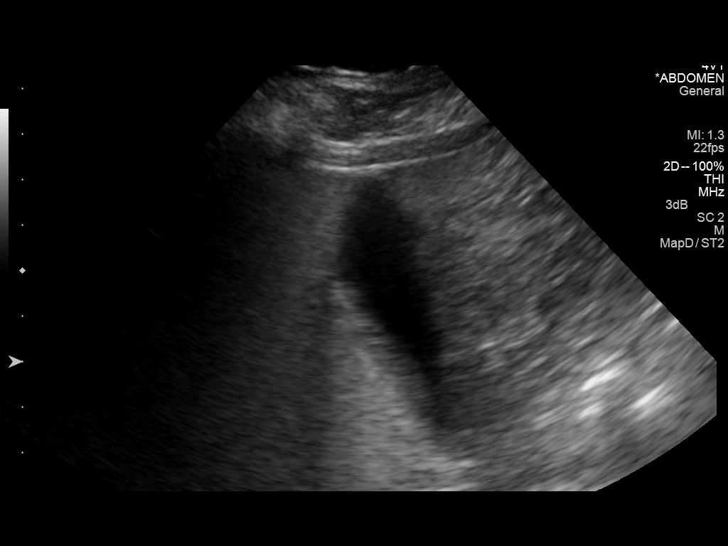
[im 54/87]
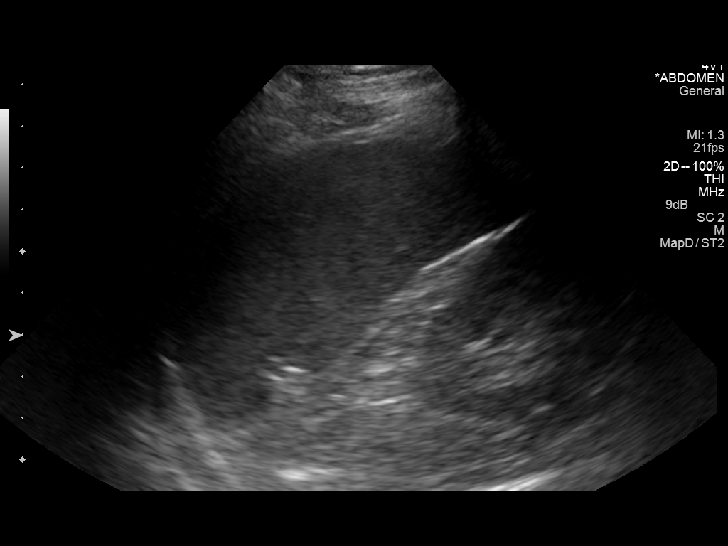
[im 58/87]
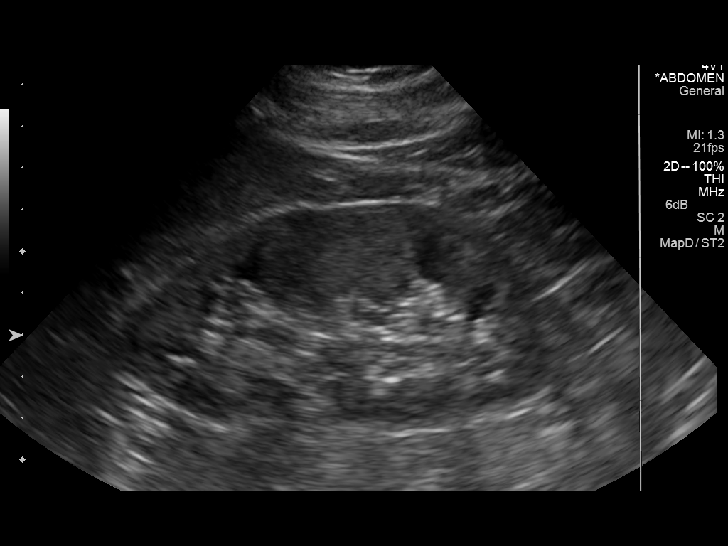
[im 65/87]
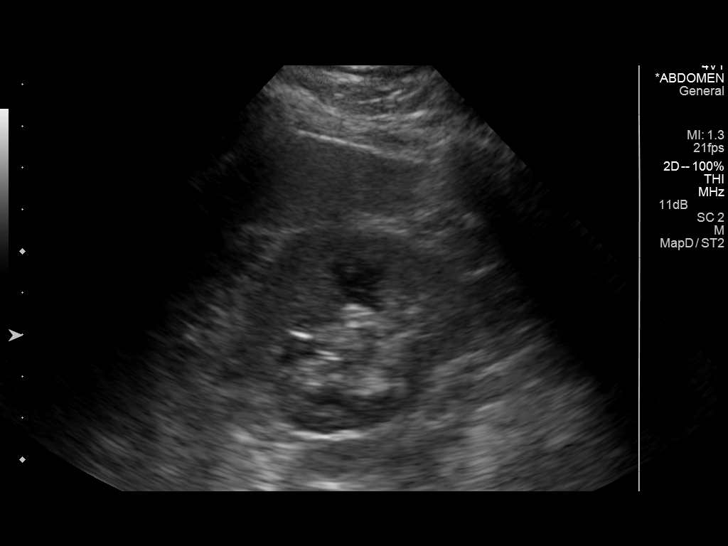
[im 72/87]
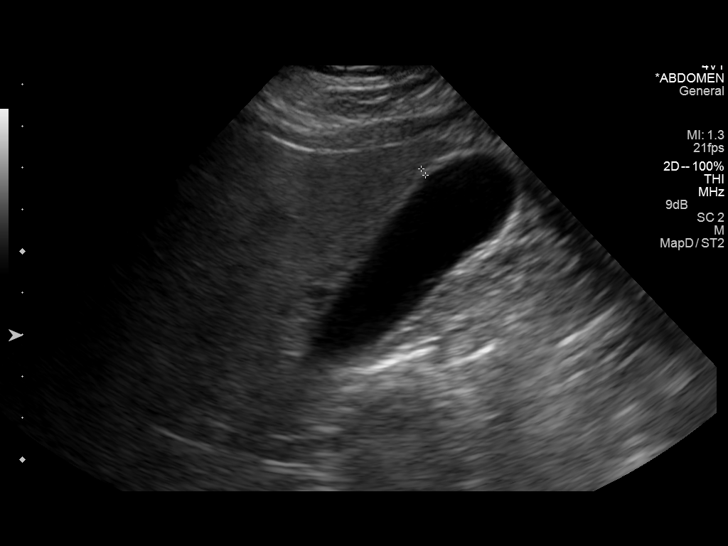
[im 79/87]
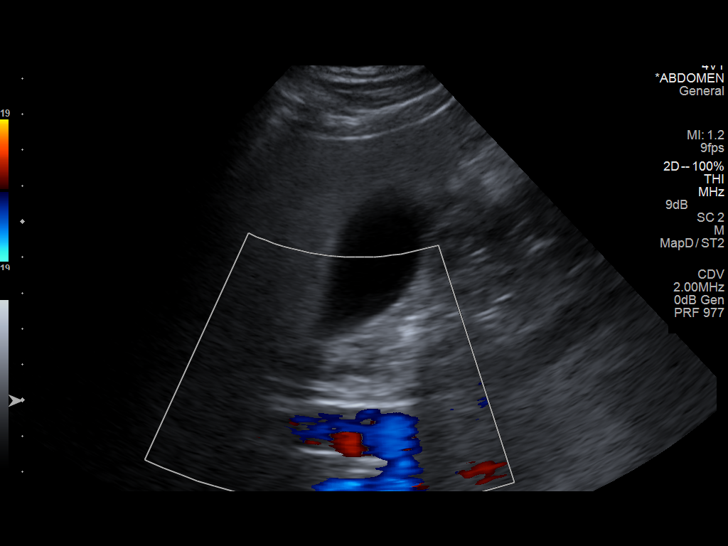
[im 87/87]
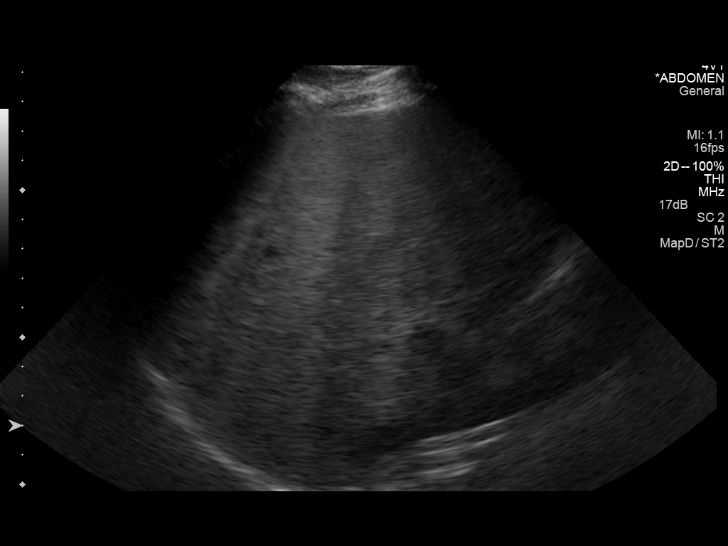

[14 of 25 positions shown; findings below may reference images not displayed]

FINDINGS: Gallbladder:

No gallstones or wall thickening visualized. No sonographic Murphy
sign noted.

Common bile duct:

Diameter: 3.4 mm

Liver:

Liver is borderline enlarged. It is echogenic with decreased through
transmission of the sound beam. No liver mass or focal lesion is
seen. Normal hepatopetal flow was documented in the portal vein.

IVC:

No abnormality visualized.

Pancreas:

Visualized portion unremarkable.

Spleen:

Size and appearance within normal limits.

Right Kidney:

Length: 11.1 cm. Echogenicity within normal limits. No mass or
hydronephrosis visualized.

Left Kidney:

Length: 10.1 cm. Echogenicity within normal limits. No mass or
hydronephrosis visualized.

Abdominal aorta:

No aneurysm visualized.

Other findings:

None.
IMPRESSION: 1. Borderline hepatomegaly with extensive hepatic steatosis.
2. Exam otherwise unremarkable. Normal gallbladder. No bile duct
dilation.

## 2016-11-21 IMAGING — US US ABDOMEN COMPLETE
1 series · 13 of 25 positions shown · non-contrast
Comparison: 06/14/2014

CLINICAL DATA: 28-year-old with abdominal pain and elevated LFTs.

EXAM:
ULTRASOUND ABDOMEN COMPLETE

[Series 1: us abdomen complete · 0.22mm/px · 13 of 82 slices shown]
[im 1/82]
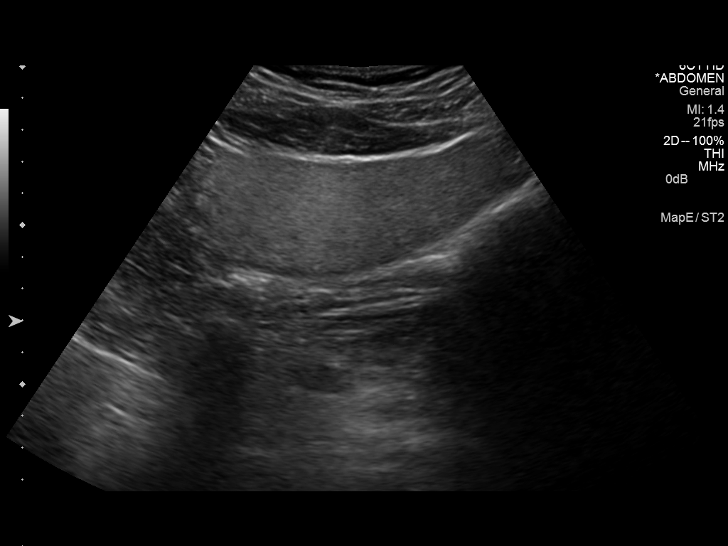
[im 7/82]
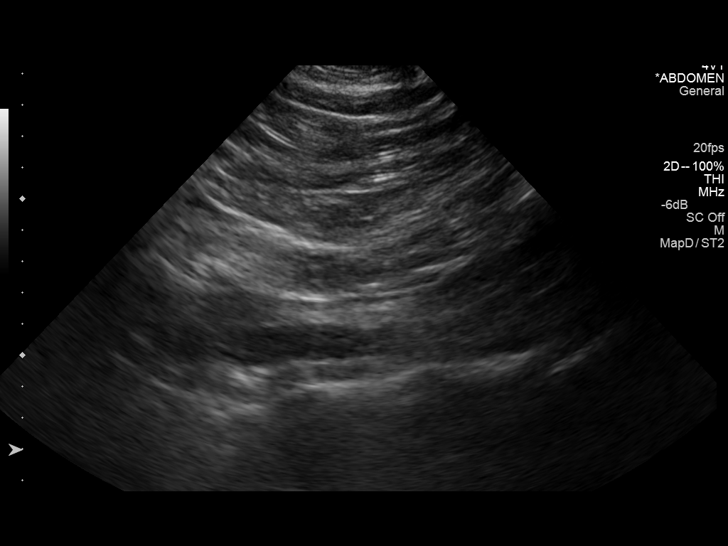
[im 14/82]
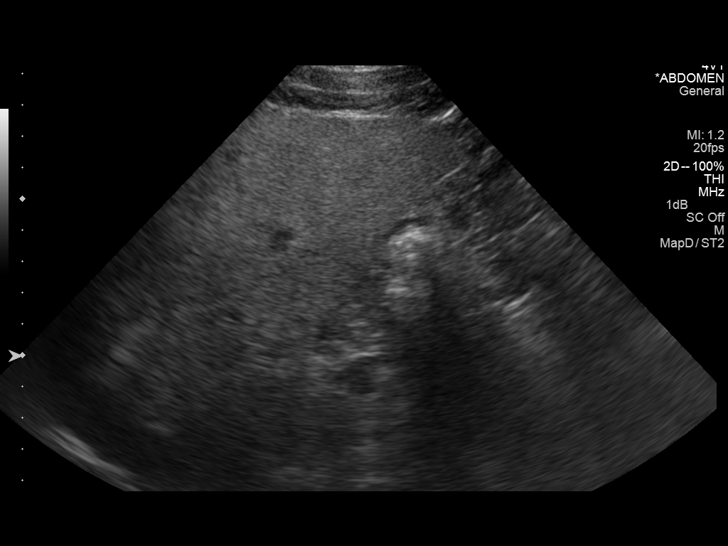
[im 21/82]
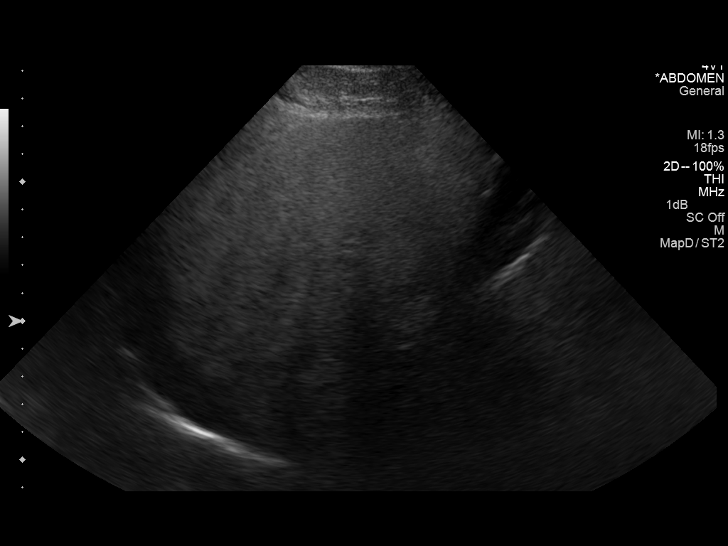
[im 28/82]
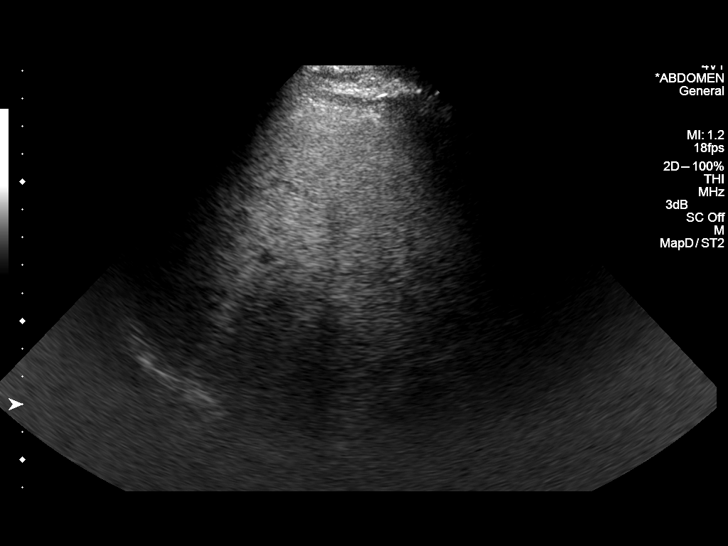
[im 34/82]
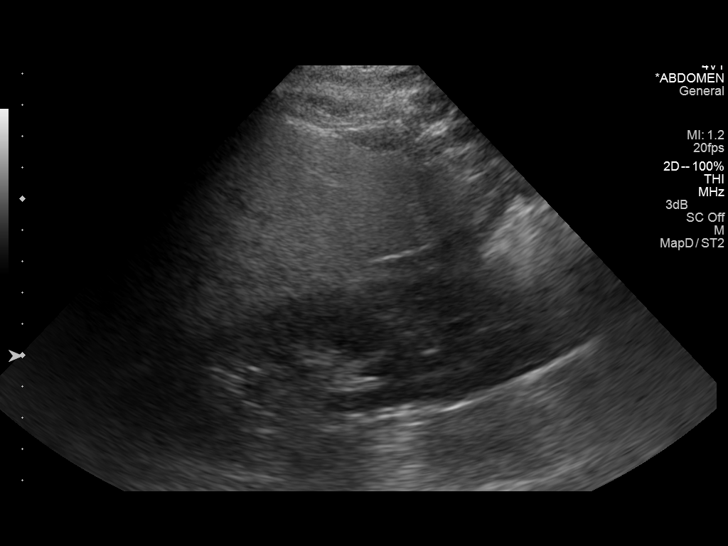
[im 41/82]
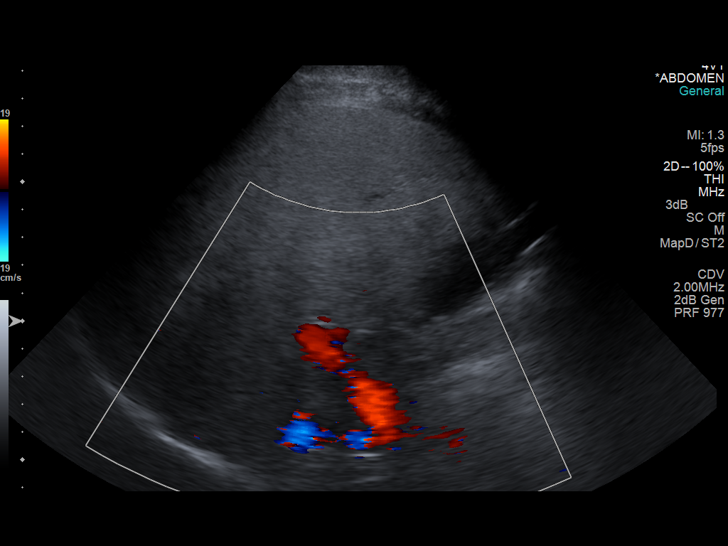
[im 48/82]
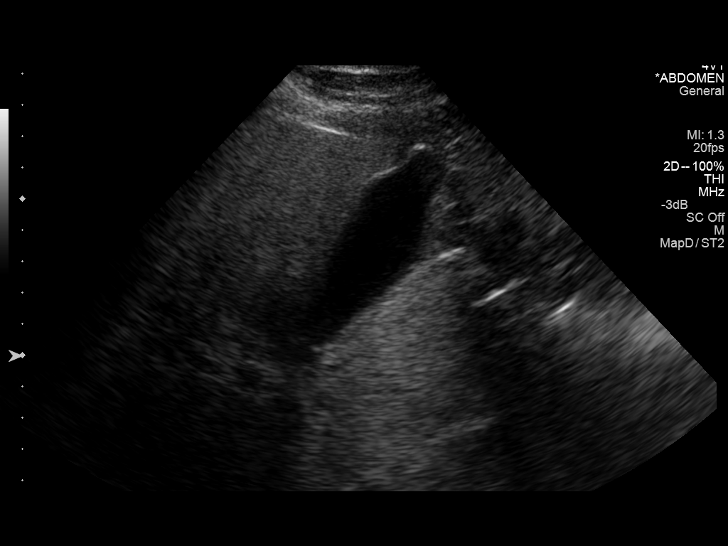
[im 55/82]
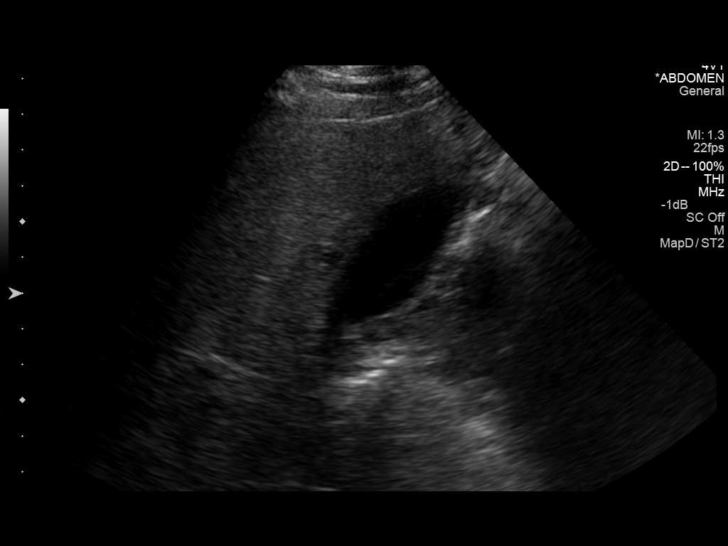
[im 61/82]
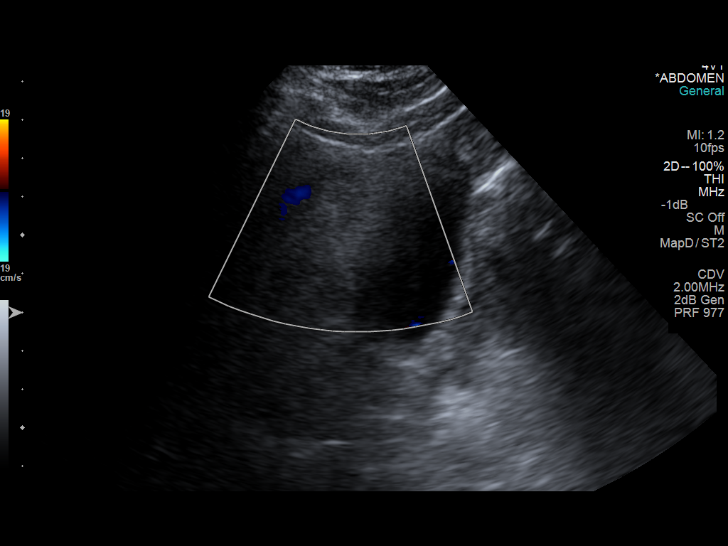
[im 68/82]
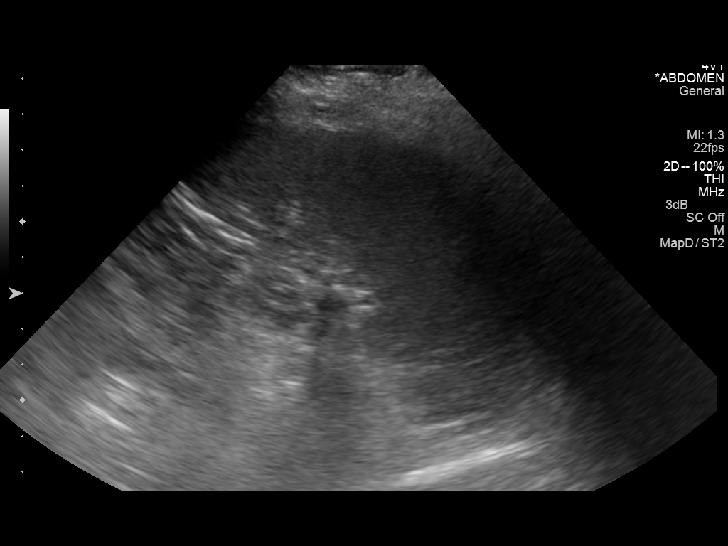
[im 75/82]
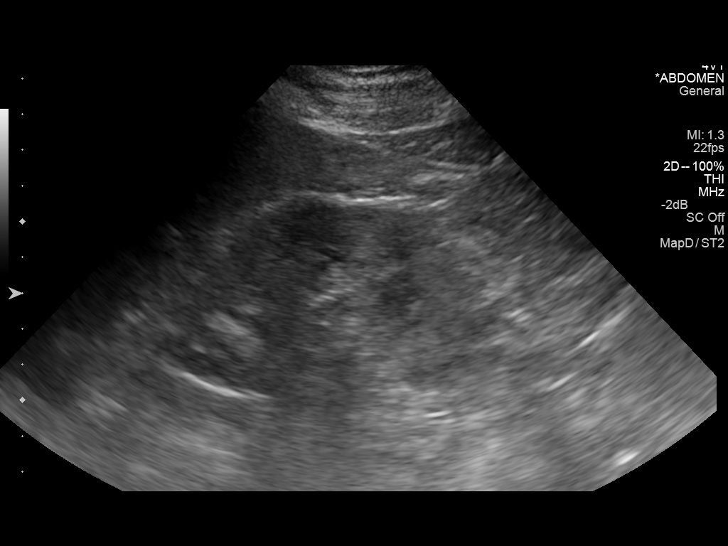
[im 82/82]
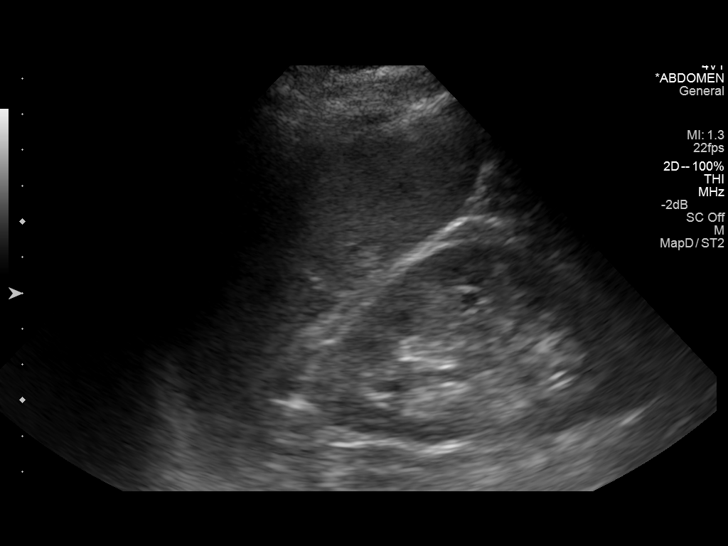

[13 of 25 positions shown; findings below may reference images not displayed]

FINDINGS: Gallbladder: No gallstones or wall thickening visualized. No
sonographic Murphy sign noted.

Common bile duct: Diameter: 0.4 cm

Liver: Liver parenchyma is echogenic with poor visualization of the
internal architecture. Findings are suggestive for hepatic
steatosis. There is a hypoechoic area adjacent to the gallbladder.
Suspect this represents focal fat sparing. This area roughly
measures up to 1.5 cm.

IVC: Limited evaluation.

Pancreas: Visualized portion unremarkable.  Limited evaluation.

Spleen: Size and appearance within normal limits.

Right Kidney: Length: 10.6 cm. Echogenicity within normal limits. No
mass or hydronephrosis visualized.

Left Kidney: Length: 11.0 cm. Echogenicity within normal limits. No
mass or hydronephrosis visualized.

Abdominal aorta: No aneurysm visualized.

Other findings: None.
IMPRESSION: The liver parenchyma is diffusely echogenic and most compatible with
hepatic steatosis. There is a focal hypoechoic area near the
gallbladder. This hypoechoic area is most suggestive for focal fat
sparing.

Normal appearance of the gallbladder. Negative for stones or biliary
dilatation.

## 2016-12-24 ENCOUNTER — Ambulatory Visit (HOSPITAL_COMMUNITY)
Admission: EM | Admit: 2016-12-24 | Discharge: 2016-12-24 | Disposition: A | Discharge status: Home / Self Care | Payer: No Typology Code available for payment source | Attending: Emergency Medicine | Admitting: Emergency Medicine

## 2016-12-24 ENCOUNTER — Encounter (HOSPITAL_COMMUNITY): Payer: Self-pay | Admitting: Emergency Medicine

## 2016-12-24 DIAGNOSIS — K2100 Gastro-esophageal reflux disease with esophagitis, without bleeding: Secondary | ICD-10-CM

## 2016-12-24 DIAGNOSIS — R748 Abnormal levels of other serum enzymes: Secondary | ICD-10-CM

## 2016-12-24 DIAGNOSIS — K2900 Acute gastritis without bleeding: Secondary | ICD-10-CM

## 2016-12-24 DIAGNOSIS — R110 Nausea: Secondary | ICD-10-CM

## 2016-12-24 DIAGNOSIS — K0889 Other specified disorders of teeth and supporting structures: Secondary | ICD-10-CM

## 2016-12-24 MED ORDER — PANTOPRAZOLE SODIUM 40 MG PO TBEC: 40.0000 mg | DELAYED_RELEASE_TABLET | Freq: Every day | ORAL | 2 refills | Status: DC

## 2016-12-24 MED ORDER — FAMOTIDINE 20 MG PO TABS: 20.0000 mg | ORAL_TABLET | Freq: Two times a day (BID) | ORAL | 0 refills | Status: DC

## 2016-12-24 MED ORDER — METOCLOPRAMIDE HCL 5 MG PO TABS: 5.0000 mg | ORAL_TABLET | Freq: Three times a day (TID) | ORAL | 3 refills | Status: DC

## 2016-12-24 MED ORDER — ONDANSETRON HCL 4 MG PO TABS: 4.0000 mg | ORAL_TABLET | Freq: Three times a day (TID) | ORAL | 0 refills | Status: DC | PRN

## 2016-12-24 MED ORDER — CLINDAMYCIN HCL 300 MG PO CAPS: 300.0000 mg | ORAL_CAPSULE | Freq: Three times a day (TID) | ORAL | 0 refills | Status: DC

## 2016-12-24 NOTE — Discharge Instructions (Signed)
Please follow up with PCP and may need a referral to GI

## 2016-12-24 NOTE — ED Provider Notes (Signed)
CSN: [id number]     Arrival date & time 12/24/16  9562 History   First MD Initiated Contact with Patient 12/24/16 1029     Chief Complaint  Patient presents with  . Abdominal Pain   (Consider location/radiation/quality/duration/timing/severity/associated sxs/prior Treatment) Patient has hx of GERD, nausea, and GI upset and has been rx'd pepcid, protonix, reglan, and zofran for UGI problems. He also has right lower jaw pain from dental pain and right lower molar pain.   The history is provided by the patient.  Abdominal Pain  Pain location:  Epigastric Pain quality: aching   Pain radiates to:  Does not radiate Pain severity:  Moderate Onset quality:  Sudden Duration:  15 days Timing:  Constant Progression:  Worsening Chronicity:  Chronic Relieved by:  Antacids Worsened by:  Nothing Ineffective treatments:  None tried Associated symptoms: belching, fatigue, nausea and vomiting     Past Medical History:  Diagnosis Date  . Allergy   . Chronic epigastric pain since 2010  . Chronic nausea    with intermittent vomiting  . Elevated LFTs   . Gastroenteritis   . Gastroparesis   . H. pylori infection 10/2013  . Hepatic steatosis    Past Surgical History:  Procedure Laterality Date  . NASAL SINUS SURGERY     Family History  Problem Relation Age of Onset  . Breast cancer      great aunt  . Diabetes Paternal Uncle   . Diabetes Maternal Grandmother   . Kidney disease Mother    Social History  Substance Use Topics  . Smoking status: Never Smoker  . Smokeless tobacco: Never Used  . Alcohol use No    Review of Systems  Constitutional: Positive for fatigue.  HENT: Negative.   Eyes: Negative.   Respiratory: Negative.   Cardiovascular: Negative.   Gastrointestinal: Positive for abdominal pain, nausea and vomiting.  Endocrine: Negative.   Genitourinary: Negative.   Musculoskeletal: Negative.   Skin: Negative.   Allergic/Immunologic: Negative.   Neurological:  Negative.   Hematological: Negative.   Psychiatric/Behavioral: Negative.     Allergies  Penicillins  Home Medications   Prior to Admission medications   Medication Sig Start Date End Date Taking? Authorizing Provider  clindamycin (CLEOCIN) 300 MG capsule Take 1 capsule (300 mg total) by mouth 3 (three) times daily. 12/24/16   Deatra Canter, FNP  famotidine (PEPCID) 20 MG tablet Take 1 tablet (20 mg total) by mouth 2 (two) times daily. 12/24/16   Deatra Canter, FNP  metoCLOPramide (REGLAN) 5 MG tablet Take 1 tablet (5 mg total) by mouth 4 (four) times daily -  before meals and at bedtime. 12/24/16   Deatra Canter, FNP  ondansetron (ZOFRAN) 4 MG tablet Take 1 tablet (4 mg total) by mouth every 8 (eight) hours as needed for nausea or vomiting. 12/24/16   Deatra Canter, FNP  pantoprazole (PROTONIX) 40 MG tablet Take 1 tablet (40 mg total) by mouth daily. 12/24/16   Deatra Canter, FNP   Meds Ordered and Administered this Visit  Medications - No data to display  BP 138/92 (BP Location: Left Arm)   Pulse 102   Temp 98.7 F (37.1 C) (Oral)   Resp 16   SpO2 99%  No data found.   Physical Exam  Constitutional: He appears well-developed and well-nourished.  HENT:  Head: Normocephalic and atraumatic.  Right Ear: External ear normal.  Left Ear: External ear normal.  Mouth/Throat: Oropharynx is clear and moist.  Eyes:  Conjunctivae and EOM are normal. Pupils are equal, round, and reactive to light.  Neck: Normal range of motion. Neck supple.  Cardiovascular: Normal rate, regular rhythm and normal heart sounds.   Pulmonary/Chest: Effort normal and breath sounds normal.  Abdominal: Soft. Bowel sounds are normal.  Nursing note and vitals reviewed.   Urgent Care Course   Clinical Course     Procedures (including critical care time)  Labs Review Labs Reviewed - No data to display  Imaging Review No results found.   Visual Acuity Review  Right Eye Distance:   Left  Eye Distance:   Bilateral Distance:    Right Eye Near:   Left Eye Near:    Bilateral Near:         MDM   1. Nausea   2. Increased liver enzymes   3. Other acute gastritis without hemorrhage   4. Gastroesophageal reflux disease with esophagitis   5. Acute superficial gastritis without hemorrhage   6. Pain, dental    Reglan 5mg  one po qid #120 Zofran 4mg  one po tid prn #30 Pepcid 20mg  one po qd #30 clincamycin 300mg  one po tid x 10 days #30 Protonix 40mg  one po qd #30  Follow up with PCP and may need referral to GI. Follow up with Dentist.     Deatra CanterWilliam J Oxford, FNP 12/24/16 1711    Deatra CanterWilliam J Oxford, FNP 12/24/16 1714

## 2016-12-24 NOTE — ED Triage Notes (Signed)
Here for intermittent abd pain onset 15 days... Reports pain is worse in the am when brushing his teeth  Sx also include vomiting  Hx of Gastritis   Denies diarrhea, fevers, urinary, constipation  LBM = today  Also c/o right sided facial/jaw/tooth pain onset 1 week  A&O x4... NAD

## 2021-08-28 ENCOUNTER — Encounter: Payer: Self-pay | Admitting: Nurse Practitioner

## 2021-08-30 ENCOUNTER — Other Ambulatory Visit: Payer: Self-pay

## 2021-08-30 ENCOUNTER — Encounter (HOSPITAL_COMMUNITY): Payer: Self-pay

## 2021-08-30 ENCOUNTER — Emergency Department (HOSPITAL_COMMUNITY)
Admission: EM | Admit: 2021-08-30 | Discharge: 2021-08-30 | Disposition: A | Discharge status: Home / Self Care | Payer: No Typology Code available for payment source | Attending: Emergency Medicine | Admitting: Emergency Medicine

## 2021-08-30 DIAGNOSIS — K219 Gastro-esophageal reflux disease without esophagitis: Secondary | ICD-10-CM | POA: Insufficient documentation

## 2021-08-30 DIAGNOSIS — R42 Dizziness and giddiness: Secondary | ICD-10-CM | POA: Insufficient documentation

## 2021-08-30 DIAGNOSIS — R531 Weakness: Secondary | ICD-10-CM | POA: Insufficient documentation

## 2021-08-30 DIAGNOSIS — R112 Nausea with vomiting, unspecified: Secondary | ICD-10-CM | POA: Insufficient documentation

## 2021-08-30 LAB — CBC
HCT: 49.1 % (ref 39.0–52.0)
Hemoglobin: 16.7 g/dL (ref 13.0–17.0)
MCH: 30.7 pg (ref 26.0–34.0)
MCHC: 34 g/dL (ref 30.0–36.0)
MCV: 90.3 fL (ref 80.0–100.0)
Platelets: 181 10*3/uL (ref 150–400)
RBC: 5.44 MIL/uL (ref 4.22–5.81)
RDW: 12.8 % (ref 11.5–15.5)
WBC: 6.8 10*3/uL (ref 4.0–10.5)
nRBC: 0 % (ref 0.0–0.2)

## 2021-08-30 LAB — COMPREHENSIVE METABOLIC PANEL
ALT: 66 U/L — ABNORMAL HIGH (ref 0–44)
AST: 45 U/L — ABNORMAL HIGH (ref 15–41)
Albumin: 4.3 g/dL (ref 3.5–5.0)
Alkaline Phosphatase: 58 U/L (ref 38–126)
Anion gap: 5 (ref 5–15)
BUN: 18 mg/dL (ref 6–20)
CO2: 29 mmol/L (ref 22–32)
Calcium: 9.2 mg/dL (ref 8.9–10.3)
Chloride: 103 mmol/L (ref 98–111)
Creatinine, Ser: 0.86 mg/dL (ref 0.61–1.24)
GFR, Estimated: >60 mL/min (ref >60)
Glucose, Bld: 110 mg/dL — ABNORMAL HIGH (ref 70–99)
Potassium: 4 mmol/L (ref 3.5–5.1)
Sodium: 137 mmol/L (ref 135–145)
Total Bilirubin: 0.6 mg/dL (ref 0.3–1.2)
Total Protein: 8.2 g/dL — ABNORMAL HIGH (ref 6.5–8.1)

## 2021-08-30 LAB — URINALYSIS, ROUTINE W REFLEX MICROSCOPIC
Bilirubin Urine: NEGATIVE
Glucose, UA: NEGATIVE mg/dL
Hgb urine dipstick: NEGATIVE
Ketones, ur: NEGATIVE mg/dL
Leukocytes,Ua: NEGATIVE
Nitrite: NEGATIVE
Protein, ur: NEGATIVE mg/dL
Specific Gravity, Urine: 1.003 — ABNORMAL LOW (ref 1.005–1.030)
pH: 8 (ref 5.0–8.0)

## 2021-08-30 MED ORDER — PANTOPRAZOLE SODIUM 20 MG PO TBEC: 20.0000 mg | DELAYED_RELEASE_TABLET | Freq: Every day | ORAL | 0 refills | Status: AC

## 2021-08-30 MED ORDER — SUCRALFATE 1 G PO TABS: 1.0000 g | ORAL_TABLET | Freq: Three times a day (TID) | ORAL | 0 refills | Status: AC

## 2021-08-30 NOTE — ED Notes (Signed)
Urine requested  ?

## 2021-08-30 NOTE — ED Provider Notes (Signed)
[facility] Provider Note   CSN: [id number] Arrival date & time: 08/30/21  0741     History Chief Complaint  Patient presents with   Dizziness   Weakness   Nausea    [name] Christensen is a 35 y.o. male with history of H. pylori who presents to the emergency department today for a 1 week history of generalized weakness and polyphagia.  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  He reports associated generalized intermittent abdominal pain which is relieved for short period of time after eating.  Also reports nausea, vomiting, and dizziness but denies any syncope, diarrhea, fever, chills, cough, sore throat, focal weakness/numbness, chest pain, shortness of breath, leg pain, and leg swelling.  The history is provided by the patient and the spouse. A language interpreter was used.  Dizziness Associated symptoms: weakness   Weakness Associated symptoms: dizziness       Past Medical History:  Diagnosis Date   Allergy    Chronic epigastric pain since 2010   Chronic nausea    with intermittent vomiting   Elevated LFTs    Gastroenteritis    Gastroparesis    H. pylori infection 10/2013   Hepatic steatosis     Patient Active Problem List   Diagnosis Date Noted   H. pylori infection 12/27/2013   Pain, dental 10/26/2013    Past Surgical History:  Procedure Laterality Date   NASAL SINUS SURGERY         Family History  Problem Relation Age of Onset   Breast cancer Unknown        great aunt   Diabetes Paternal Uncle    Diabetes Maternal Grandmother    Kidney disease Mother     Social History   Tobacco Use   Smoking status: Never   Smokeless tobacco: Never  Substance Use Topics   Alcohol use: No   Drug use: No    Home Medications Prior to Admission medications   Medication Sig Start Date End Date Taking? Authorizing Provider   pantoprazole (PROTONIX) 20 MG tablet Take 1 tablet (20 mg total) by mouth daily. 08/30/21  Yes Meredeth Ide, Dareth Andrew M, PA-C  sucralfate (CARAFATE) 1 g tablet Take 1 tablet (1 g total) by mouth 4 (four) times daily -  with meals and at bedtime. 08/30/21  Yes Meredeth Ide, Versia Mignogna M, PA-C  clindamycin (CLEOCIN) 300 MG capsule Take 1 capsule (300 mg total) by mouth 3 (three) times daily. 12/24/16   Deatra Canter, FNP  famotidine (PEPCID) 20 MG tablet Take 1 tablet (20 mg total) by mouth 2 (two) times daily. 12/24/16   Deatra Canter, FNP  metoCLOPramide (REGLAN) 5 MG tablet Take 1 tablet (5 mg total) by mouth 4 (four) times daily -  before meals and at bedtime. 12/24/16   Deatra Canter, FNP  ondansetron (ZOFRAN) 4 MG tablet Take 1 tablet (4 mg total) by mouth every 8 (eight) hours as needed for nausea or vomiting. 12/24/16   Deatra Canter, FNP    Allergies    Penicillins  Review of Systems   Review of Systems  Neurological:  Positive for dizziness and weakness.  All other systems reviewed and are negative.  Physical Exam Updated Vital Signs BP (!) 154/94 (BP Location: Left Arm)   Pulse 87   Temp 98.4 F (36.9 C)   Resp 17   SpO2 97%  Physical Exam Constitutional:      General: He is not in acute distress.    Appearance: Normal appearance.  HENT:     Head: Normocephalic and atraumatic.  Eyes:     General:        Right eye: No discharge.        Left eye: No discharge.  Cardiovascular:     Comments: Regular rate and rhythm.  S1/S2 are distinct without any evidence of murmur, rubs, or gallops.  Radial pulses are 2+ bilaterally.  Dorsalis pedis pulses are 2+ bilaterally.  No evidence of pedal edema. Pulmonary:     Comments: Clear to auscultation bilaterally.  Normal effort.  No respiratory distress.  No evidence of wheezes, rales, or rhonchi heard throughout. Abdominal:     General: Abdomen is flat. Bowel sounds are normal. There is no distension.     Tenderness: There is no  abdominal tenderness. There is no guarding or rebound.  Musculoskeletal:        General: Normal range of motion.     Cervical back: Neck supple.     Right lower leg: No edema.     Left lower leg: No edema.  Skin:    General: Skin is warm and dry.     Findings: No rash.  Neurological:     General: No focal deficit present.     Mental Status: He is alert.  Psychiatric:        Mood and Affect: Mood normal.        Behavior: Behavior normal.    ED Results / Procedures / Treatments   Labs (all labs ordered are listed, but only abnormal results are displayed) Labs Reviewed  COMPREHENSIVE METABOLIC PANEL - Abnormal; Notable for the following components:      Result Value   Glucose, Bld 110 (*)    Total Protein 8.2 (*)    AST 45 (*)    ALT 66 (*)    All other components within normal limits  CBC  URINALYSIS, ROUTINE W REFLEX MICROSCOPIC    EKG None  Radiology No results found.  Procedures Procedures   Medications Ordered in ED Medications - No data to display  ED Course  I have reviewed the triage vital signs and the nursing notes.  Pertinent labs & imaging results that were available during my care of the patient were reviewed by me and considered in my medical decision making (see chart for details).  Clinical Course as of 08/30/21 0950  Sat Aug 30, 2021  0946 I discussed this case with my attending physician who cosigned this note including patient's presenting symptoms, physical exam, and planned diagnostics and interventions. Attending physician stated agreement with plan or made changes to plan which were implemented.   Attending physician assessed patient at bedside.   [CF]    Clinical Course User Index [CF] Jolyn Lent   MDM Rules/Calculators/A&P                          [name] Christensen is a 35 y.o. male who presents to the emergency department today for further evaluation of abdominal pain and generalized weakness.  Abdominal exam is without  peritoneal signs.  He is currently euvolemic without any evidence of acute dehydration. I have a low suspicion for acute intra abdominal pathology at this time.  No recent travel.  He is not immunocompromised.  CBC is without leukocytosis and otherwise normal.  Patient has mild transaminitis.  Low suspicion for acute hepatobiliary pathology at this time.  UA is pending.  At this time do not feel abdominal imaging is indicated.  Given the clinical scenario.  He is safe for discharge.  He has an appointment with gastroenterology next month.  I will have him keep that appointment.  I have provided a prescription for Protonix and Carafate.  Strict return precautions given.  Final Clinical Impression(s) / ED Diagnoses Final diagnoses:  Gastroesophageal reflux disease without esophagitis    Rx / DC Orders ED Discharge Orders          Ordered    sucralfate (CARAFATE) 1 g tablet  3 times daily with meals & bedtime        08/30/21 0941    pantoprazole (PROTONIX) 20 MG tablet  Daily        08/30/21 0941             Teressa Lower, PA-C 08/30/21 0950    Lorre Nick, MD 08/30/21 1148

## 2021-08-30 NOTE — ED Triage Notes (Signed)
Pt reports generalized weakness, dizziness, and nausea for about 1 week.

## 2021-08-30 NOTE — ED Provider Notes (Signed)
I provided a substantive portion of the care of this patient.  I personally performed the entirety of the medical decision making for this encounter.      35 year old male presents with symptoms consistent with GERD.  Labs are reassuring here.  Will place on PPI and Carafate discharge   Lorre Nick, MD 08/30/21 [phone]

## 2021-08-30 NOTE — Discharge Instructions (Addendum)
You were seen and evaluated in the emergency department today for further evaluation of generalized weakness and abdominal pain.  As we discussed, this is likely GERD.  Please keep your appointment with the GI doctor for next month.  Please return to the emergency department if you are experiencing worsening abdominal pain, blood in your throw up or poop, increasing generalized weakness, or any other concerns you may have.  Please take Protonix and Carafate as prescribed.  If you have Protonix at home you may take that instead of the one I prescribed today.

## 2021-09-02 ENCOUNTER — Emergency Department (HOSPITAL_COMMUNITY)
Admission: EM | Admit: 2021-09-02 | Discharge: 2021-09-02 | Discharge status: Against Medical Advice | Payer: No Typology Code available for payment source | Attending: Student | Admitting: Student

## 2021-09-02 ENCOUNTER — Other Ambulatory Visit: Payer: Self-pay

## 2021-09-02 DIAGNOSIS — R42 Dizziness and giddiness: Secondary | ICD-10-CM | POA: Insufficient documentation

## 2021-09-02 DIAGNOSIS — R1012 Left upper quadrant pain: Secondary | ICD-10-CM | POA: Insufficient documentation

## 2021-09-02 DIAGNOSIS — Z5321 Procedure and treatment not carried out due to patient leaving prior to being seen by health care provider: Secondary | ICD-10-CM | POA: Insufficient documentation

## 2021-09-02 DIAGNOSIS — R519 Headache, unspecified: Secondary | ICD-10-CM | POA: Insufficient documentation

## 2021-09-02 DIAGNOSIS — R531 Weakness: Secondary | ICD-10-CM | POA: Insufficient documentation

## 2021-09-02 LAB — URINALYSIS, ROUTINE W REFLEX MICROSCOPIC
Bacteria, UA: NONE SEEN
Bilirubin Urine: NEGATIVE
Glucose, UA: NEGATIVE mg/dL
Hgb urine dipstick: NEGATIVE
Ketones, ur: NEGATIVE mg/dL
Leukocytes,Ua: NEGATIVE
Nitrite: NEGATIVE
Protein, ur: NEGATIVE mg/dL
Specific Gravity, Urine: 1.005 (ref 1.005–1.030)
pH: 7 (ref 5.0–8.0)

## 2021-09-02 NOTE — ED Triage Notes (Signed)
Patient reports having abdominal pain and weakness. He also has had a headache. He has been feeling like this all night he could not sleep.

## 2021-09-02 NOTE — ED Provider Notes (Signed)
Emergency Medicine Provider Triage Evaluation Note  [name] Christensen , a 35 y.o. male  was evaluated in triage.  Pt complains of dizziness and continued abdominal pain.  Patient was seen in the ED for similar symptoms on 9/17, felt to likely have GERD versus gastritis versus PUD and treated with PPI and Carafate.  Today returns stating that overnight he was continuing to have intermittent abdominal pain and was also experiencing intermittent dizziness, he reports when he would wake up he would feel a swimmy sensation primarily with his eyes.  Reports he does not feel like he is going to pass out but does feel generally weak.  Also reports a mild headache.  No blood in stool or emesis, no melena reported  Review of Systems  Positive: Abdominal pain, dizziness, weakness Negative: No fever, melena, hematochezia  Physical Exam  BP (!) 154/112 (BP Location: Left Arm)   Pulse (!) 102   Temp 98.6 F (37 C) (Oral)   Resp 17   Ht 5\' 6"  (1.676 m)   Wt 103 kg   SpO2 98%   BMI 36.64 kg/m  Gen:   Awake, no distress   Resp:  Normal effort  MSK:   Moves extremities without difficulty  Other:  Mild left upper quadrant tenderness, no focal neurologic deficits noted  Medical Decision Making  Medically screening exam initiated at 7:32 AM.  Appropriate orders placed.  [name] Christensen was informed that the remainder of the evaluation will be completed by another provider, this initial triage assessment does not replace that evaluation, and the importance of remaining in the ED until their evaluation is complete.     [name] Slates, PA-C 09/02/21 09/04/21    3500, MD 09/02/21 2259

## 2021-09-02 NOTE — ED Notes (Signed)
Patient has a urine culture in the main lab 

## 2021-09-24 ENCOUNTER — Ambulatory Visit (INDEPENDENT_AMBULATORY_CARE_PROVIDER_SITE_OTHER): Payer: Self-pay | Admitting: Nurse Practitioner

## 2021-09-24 ENCOUNTER — Encounter: Payer: Self-pay | Admitting: Nurse Practitioner

## 2021-09-24 VITALS — BP 121/70 | HR 90 | Ht 66.0 in | Wt 229.2 lb

## 2021-09-24 DIAGNOSIS — K2970 Gastritis, unspecified, without bleeding: Secondary | ICD-10-CM

## 2021-09-24 DIAGNOSIS — K3184 Gastroparesis: Secondary | ICD-10-CM

## 2021-09-24 DIAGNOSIS — F411 Generalized anxiety disorder: Secondary | ICD-10-CM

## 2021-09-24 DIAGNOSIS — R7989 Other specified abnormal findings of blood chemistry: Secondary | ICD-10-CM

## 2021-09-24 DIAGNOSIS — R635 Abnormal weight gain: Secondary | ICD-10-CM

## 2021-09-24 DIAGNOSIS — R1013 Epigastric pain: Secondary | ICD-10-CM

## 2021-09-24 DIAGNOSIS — K760 Fatty (change of) liver, not elsewhere classified: Secondary | ICD-10-CM

## 2021-09-24 NOTE — Patient Instructions (Addendum)
If you are age 36 or younger, your body mass index should be between 19-25. Your Body mass index is 37 kg/m. If this is out of the aformentioned range listed, please consider follow up with your Primary Care Provider.   RECOMMENDATIONS: We have given you a [place] Financial Assistance form to fill out. Please complete this as soon as possible. Please follow up with your primary care provider regarding anxiety management. Exercise as tolerated and work on weight loss. Please call to schedule a follow up with Dr. Rhea Belton in [place]. His schedule books up fast so try and call in late November or early December. Stop Protonix ans Sucralfate for 5 days then complete the H.pylori stool test we have ordered today. After you send in the stool sample please restart the medications.  It was great seeing you today! Thank you for entrusting me with your care and choosing [facility].  [name] Christensen, CRNP  Si tiene [address] aos o menos, su ndice de masa corporal debe estar entre 19 y 28. Su ndice de masa corporal es de 37 kg/m. Si esto est fuera del rango mencionado anteriormente, considere hacer un seguimiento con su proveedor de Marine scientist.  RECOMENDACIONES: Conley Rolls hemos dado un formulario de asistencia financiera de [place] para que lo llene. Complete esto lo antes posible. Haga un seguimiento con su proveedor de atencin primaria con respecto al manejo de la ansiedad. Haga ejercicio segn lo tolere y trabaje en la prdida de [place]. Llame para programar un seguimiento con el Dr. Rhea Belton en enero. Su agenda se llena rpidamente, as que trate de llamar a fines de noviembre o principios de diciembre. Suspenda Protonix y sucralfato durante 5 das y luego complete la prueba de heces para H. pylori que hemos pedido hoy. Despus de enviar la muestra de heces, reinicie los medicamentos.  Fue genial verte hoy! Gracias por confiarme su atencin y elegir [facility].  [name] Christensen, CRNP

## 2021-09-24 NOTE — Progress Notes (Signed)
..    09/24/2021 [name] Christensen [id number] 1986-03-03   CHIEF COMPLAINT: Stomach feels empty   HISTORY OF PRESENT ILLNESS: [name] Christensen is a 35 year old male with a past medical history of H. Pylori gastritis 2010 per EGD done in [place], gastroparesis, elevated LFTS and hepatic steatosis. He presents to our office today for further evaluation regarding upper abdominal pain, stomach always feels emptied. He is accompanied by a [place] Spanish interpretor as he speaks limited [place]. He stated these are the same symptoms he had prior to his EGD in 2015. He underwent an EGD by Dr. Rhea Belton 06/18/2014 which showed retained food in the stomach and gastric biopsies were consistent with mild chronic inflammation without evidence of H. Pylori. He subsequently underwent a gastric empty study which confirmed gastroparesis. No history of diabetes. He denies marijuana use.   He was seen in the ED on 08/30/2021 and 09/02/2021 with dizziness and upper abdominal pain. He was prescribed Pantoprazole 20mg  QD and Carafate 1 gm po quid with the instructions to follow up in our office.   He complains of having dizziness, feeling light headed at times with an increased appetite. No CP or SOB. He feels "lazy", low energy level. He eats more food with the intention of increasing his energy level which resulted in a "20lb" weight gain over the past month. He feels anxious due to his weight gain and increased appetite. He awakens in the morning and his stomach feels empty with epigastric discomfort.  He has intermittent nausea and reported vomiting clear emesis once every two weeks for the past few months. No hematemesis or coffee ground emesis. He typically eats a [place] sandwich and a banana and drinks a vegetable smoothie for breakfast. He eats steak or other meat with rice and beans for lunch and dinner. He is passing a normal formed brown stool once daily. No rectal bleeding or black stools.    CBC Latest Ref Rng &  Units 08/30/2021 11/05/2014 04/20/2014  WBC 4.0 - 10.5 K/uL 6.8 7.5 15.8(H)  Hemoglobin 13.0 - 17.0 g/dL 06/20/2014 17.1(H) 16.9  Hematocrit 39.0 - 52.0 % 49.1 49.1 47.2  Platelets 150 - 400 K/uL 181 197 214    CMP Latest Ref Rng & Units 08/30/2021 12/27/2014 11/05/2014  Glucose 70 - 99 mg/dL 11/07/2014) - 99  BUN 6 - 20 mg/dL 18 - 5(L)  Creatinine 884(Z - 1.24 mg/dL 6.60 - 6.30  Sodium 1.60 - 145 mmol/L 137 - 140  Potassium 3.5 - 5.1 mmol/L 4.0 - 4.3  Chloride 98 - 111 mmol/L 103 - 101  CO2 22 - 32 mmol/L 29 - 28  Calcium 8.9 - 10.3 mg/dL 9.2 - 9.4  Total Protein 6.5 - 8.1 g/dL 8.2(H) 8.1 8.1  Total Bilirubin 0.3 - 1.2 mg/dL 0.6 0.4 0.4  Alkaline Phos 38 - 126 U/L 58 96 103  AST 15 - 41 U/L 45(H) 107(H) 149(H)  ALT 0 - 44 U/L 66(H) 183(H) 202(H)    Ceruloplasmin 27  on 12/27/2014 SMA < 20 and ANA negative on 12/27/2014 Hep B surface antigen negative, Hep B surface antibody (Hep b-post 31.4 consistent with immunity). Hep C antibody negative, ferritin 65.8 and IgG 1500, 06/21/2014  EGD 06/18/2014 by Dr. 08/19/2014: 1. The mucosa of the esophagus appeared normal 2. Retained food in the proximal stomach, precluding complete visualization of gastric mucosa; query gastroparesis 3. The examined mucosa of the stomach appeared normal; biopsies were taken in the antrum and angularis 4. The duodenal mucosa  showed no abnormalities in the bulb and second portion of the duodenum - MILD CHRONIC INFLAMMATION. - NEGATIVE FOR HELICOBACTER PYLORI. - NO INTESTINAL METAPLASIA, DYSPLASIA OR MALIGNANCY.  Gastric empty study 01/07/2015: Abnormal gastric emptying study with significant retention of gastric activity at 2 hr.  Abdominal sonogram 11/05/2014: The liver parenchyma is diffusely echogenic and most compatible with hepatic steatosis. There is a focal hypoechoic area near the gallbladder. This hypoechoic area is most suggestive for focal fat sparing.  Normal appearance of the gallbladder. Negative for stones or  biliary dilatation  Past Medical History:  Diagnosis Date   Allergy    Chronic epigastric pain since 2010   Chronic nausea    with intermittent vomiting   Elevated LFTs    Gastroenteritis    Gastroparesis    H. pylori infection 10/2013   Hepatic steatosis    Past Surgical History:  Procedure Laterality Date   NASAL SINUS SURGERY     Social History: No alcohol use. No drug use.   Family History:  Mother with kidney disease. No family history of esophageal, gastric or colon cancer.    Allergies  Allergen Reactions   Penicillins     Increased heart rate    Outpatient Encounter Medications as of 09/24/2021  Medication Sig   clindamycin (CLEOCIN) 300 MG capsule Take 1 capsule (300 mg total) by mouth 3 (three) times daily.   famotidine (PEPCID) 20 MG tablet Take 1 tablet (20 mg total) by mouth 2 (two) times daily.   metoCLOPramide (REGLAN) 5 MG tablet Take 1 tablet (5 mg total) by mouth 4 (four) times daily -  before meals and at bedtime.   ondansetron (ZOFRAN) 4 MG tablet Take 1 tablet (4 mg total) by mouth every 8 (eight) hours as needed for nausea or vomiting.   pantoprazole (PROTONIX) 20 MG tablet Take 1 tablet (20 mg total) by mouth daily.   sucralfate (CARAFATE) 1 g tablet Take 1 tablet (1 g total) by mouth 4 (four) times daily -  with meals and at bedtime.   No facility-administered encounter medications on file as of 09/24/2021.   REVIEW OF SYSTEMS:  Gen: Denies fever, sweats or chills. No weight loss.  CV: Denies chest pain, palpitations or edema. Resp: Denies cough, shortness of breath of hemoptysis.  GI: See HPI.  GU : Denies urinary burning, blood in urine, increased urinary frequency or incontinence. MS: Denies joint pain, muscles aches or weakness. Derm: Denies rash, itchiness, skin lesions or unhealing ulcers. Psych: Denies depression, anxiety or memory loss.  Heme: Denies bruising, bleeding. Neuro:  Denies headaches, dizziness or paresthesias. Endo:   Denies any problems with DM, thyroid or adrenal function.  PHYSICAL EXAM: BP 121/70   Pulse 90   Ht 5\' 6"  (1.676 m)   Wt 229 lb 4 oz (104 kg)   BMI 37.00 kg/m   Wt Readings from Last 3 Encounters:  09/24/21 229 lb 4 oz (104 kg)  09/02/21 227 lb (103 kg)  01/11/15 221 lb (100.2 kg)    Patient reported his weight 209 3 months ago.  General: 35 year old male in NAD.  Head: Normocephalic and atraumatic. Eyes:  Sclerae non-icteric, conjunctive pink. Ears: Normal auditory acuity. Mouth: Dentition intact. No ulcers or lesions.  Neck: Supple, no lymphadenopathy or thyromegaly.  Lungs: Clear bilaterally to auscultation without wheezes, crackles or rhonchi. Heart: Regular rate and rhythm. No murmur, rub or gallop appreciated.  Abdomen: Soft, nontender, non distended. No masses. No hepatosplenomegaly. Normoactive bowel sounds x  4 quadrants.  Rectal: Deferred.  Musculoskeletal: Symmetrical with no gross deformities. Skin: Warm and dry. No rash or lesions on visible extremities. Extremities: No edema. Neurological: Alert oriented x 4, no focal deficits.  Psychological:  Alert and cooperative. Normal mood and affect.  ASSESSMENT AND PLAN:  53) 35 year old male with a reported history of H. Pylori gastritis in 2010, s/p EGD 06/18/2014 showed gastritis without H. Pylori with recurrent epigastric pain, stomach always feels emptied with increased appetite and weight gain.  -H. Pylori stool antigen, patient instructed hold Pantoprazole and Sucralfate for 5 days prior to completing H. Pylori stool test, may restart after stool specimen sent to lab -Patient provided with [facility] financial assistance application  -I discussed scheduling a repeat EGD if symptoms persist or worsen and when he has financial assistance  -Follow up in office in 2 months and as needed   2) Gastroparesis  -Advised 3 to 4 small snack sized meals  -Avoid fatty foods   3) Hepatic steatosis with elevated LFTS. -I  discussed he as increased risk for fatty liver disease -Exercise and weight loss recommended  -I will refer to [facility] after he receives [facility] Financial assistance  -Check Hep A total ab and Hep B core total level with next lab draw  4) Anxiety -I advised the patient to follow up with his PCP for anxiety management.     CC:  No ref. provider found

## 2021-10-08 NOTE — Progress Notes (Signed)
Addendum: Reviewed and agree with assessment and management plan. Plummer Matich M, MD  

## 2021-10-10 ENCOUNTER — Other Ambulatory Visit: Payer: Self-pay

## 2021-10-13 ENCOUNTER — Other Ambulatory Visit: Payer: Self-pay

## 2021-10-13 DIAGNOSIS — R635 Abnormal weight gain: Secondary | ICD-10-CM

## 2021-10-13 DIAGNOSIS — K3184 Gastroparesis: Secondary | ICD-10-CM

## 2021-10-13 DIAGNOSIS — R1013 Epigastric pain: Secondary | ICD-10-CM

## 2021-10-13 DIAGNOSIS — K760 Fatty (change of) liver, not elsewhere classified: Secondary | ICD-10-CM

## 2021-10-13 DIAGNOSIS — F411 Generalized anxiety disorder: Secondary | ICD-10-CM

## 2021-10-15 LAB — H. PYLORI ANTIGEN, STOOL: H pylori Ag, Stl: NEGATIVE

## 2023-08-01 ENCOUNTER — Encounter (HOSPITAL_COMMUNITY): Payer: Self-pay

## 2023-08-01 ENCOUNTER — Emergency Department (HOSPITAL_COMMUNITY)
Admission: EM | Admit: 2023-08-01 | Discharge: 2023-08-01 | Disposition: A | Discharge status: Home / Self Care | Payer: Self-pay | Attending: Emergency Medicine | Admitting: Emergency Medicine

## 2023-08-01 ENCOUNTER — Emergency Department (HOSPITAL_COMMUNITY): Payer: Self-pay

## 2023-08-01 DIAGNOSIS — M25512 Pain in left shoulder: Secondary | ICD-10-CM | POA: Insufficient documentation

## 2023-08-01 LAB — BASIC METABOLIC PANEL
Anion gap: 7 (ref 5–15)
BUN: 10 mg/dL (ref 6–20)
CO2: 27 mmol/L (ref 22–32)
Calcium: 9 mg/dL (ref 8.9–10.3)
Chloride: 101 mmol/L (ref 98–111)
Creatinine, Ser: 0.84 mg/dL (ref 0.61–1.24)
GFR, Estimated: >60 mL/min (ref >60)
Glucose, Bld: 108 mg/dL — ABNORMAL HIGH (ref 70–99)
Potassium: 4.1 mmol/L (ref 3.5–5.1)
Sodium: 135 mmol/L (ref 135–145)

## 2023-08-01 LAB — CK: Total CK: 250 U/L (ref 49–397)

## 2023-08-01 MED ORDER — NAPROXEN 500 MG PO TABS: 500.0000 mg | ORAL_TABLET | Freq: Two times a day (BID) | ORAL | 0 refills | Status: AC

## 2023-08-01 MED ORDER — METHOCARBAMOL 500 MG PO TABS: 500.0000 mg | ORAL_TABLET | Freq: Two times a day (BID) | ORAL | 0 refills | Status: AC

## 2023-08-01 MED ORDER — LIDOCAINE 5 % EX PTCH: 1.0000 | MEDICATED_PATCH | CUTANEOUS | 0 refills | Status: AC

## 2023-08-01 NOTE — ED Provider Notes (Signed)
[place] EMERGENCY DEPARTMENT AT [facility] Provider Note   CSN: [id number] Arrival date & time: 08/01/23  8119     History  Chief Complaint  Patient presents with   Shoulder Pain    [name] Christensen is a 38 y.o. male here for evaluation of left shoulder pain.  He has been working out more doing overhead movements.  Feels like his left shoulder has been aching.  Has been doing Tylenol, ibuprofen.  Symptoms have still been worsening.  He denies any numbness or weakness.  No chest pain, shortness of breath.  No arm swelling.  Denies any generalized myalgias, history of rhabdomyolysis.  No neck pain.  HPI     Home Medications Prior to Admission medications   Medication Sig Start Date End Date Taking? Authorizing Provider  lidocaine (LIDODERM) 5 % Place 1 patch onto the skin daily. Remove & Discard patch within 12 hours or as directed by MD 08/01/23  Yes Everlynn Sagun A, PA-C  methocarbamol (ROBAXIN) 500 MG tablet Take 1 tablet (500 mg total) by mouth 2 (two) times daily. 08/01/23  Yes Od[place] Nishi A, PA-C  naproxen (NAPROSYN) 500 MG tablet Take 1 tablet (500 mg total) by mouth 2 (two) times daily. 08/01/23  Yes Kaoir Loree A, PA-C  pantoprazole (PROTONIX) 20 MG tablet Take 1 tablet (20 mg total) by mouth daily. 08/30/21   Honor Loh M, PA-C  sucralfate (CARAFATE) 1 g tablet Take 1 tablet (1 g total) by mouth 4 (four) times daily -  with meals and at bedtime. 08/30/21   Teressa Lower, PA-C      Allergies    Penicillins    Review of Systems   Review of Systems  Constitutional: Negative.   HENT: Negative.    Respiratory: Negative.    Cardiovascular: Negative.   Gastrointestinal: Negative.   Genitourinary: Negative.   Musculoskeletal:  Negative for neck pain and neck stiffness.       Left shoulder pain  Skin: Negative.   Neurological: Negative.   All other systems reviewed and are negative.   Physical Exam Updated Vital Signs BP 128/85 (BP  Location: Right Arm)   Pulse 89   Temp 98 F (36.7 C) (Oral)   Resp 19   Ht 5\' 6"  (1.676 m)   Wt 106 kg   SpO2 97%   BMI 37.72 kg/m  Physical Exam Vitals and nursing note reviewed.  Constitutional:      General: He is not in acute distress.    Appearance: He is well-developed. He is not ill-appearing or diaphoretic.  HENT:     Head: Atraumatic.  Eyes:     Pupils: Pupils are equal, round, and reactive to light.  Cardiovascular:     Rate and Rhythm: Normal rate and regular rhythm.  Pulmonary:     Effort: Pulmonary effort is normal. No respiratory distress.  Abdominal:     General: There is no distension.     Palpations: Abdomen is soft.  Musculoskeletal:        General: Normal range of motion.     Right shoulder: Normal.     Left shoulder: Tenderness present. No swelling, deformity, effusion, laceration or crepitus. Normal range of motion. Normal strength.       Arms:     Cervical back: Normal, normal range of motion and neck supple.     Thoracic back: Normal.     Comments: C/T/L without tenderness.  Non tender to forearm, humerus on left.  Tenderness to  left anterior shoulder.  Pain worse with overhead motion.  Negative Hawkins, empty can.  No obvious joint effusion.  Compartments soft.  Skin:    General: Skin is warm and dry.     Capillary Refill: Capillary refill takes less than 2 seconds.     Comments: No edema, erythema or warmth  Neurological:     General: No focal deficit present.     Mental Status: He is alert and oriented to person, place, and time.     Sensory: Sensation is intact.     Motor: Motor function is intact.     Comments: Full strength Intact sensation     ED Results / Procedures / Treatments   Labs (all labs ordered are listed, but only abnormal results are displayed) Labs Reviewed  BASIC METABOLIC PANEL - Abnormal; Notable for the following components:      Result Value   Glucose, Bld 108 (*)    All other components within normal limits  CK     EKG None  Radiology DG Shoulder Left  Result Date: 08/01/2023 CLINICAL DATA:  Four day history of shooting left shoulder pain after bench pressing with dumbbells. EXAM: LEFT SHOULDER - 2+ VIEW COMPARISON:  None Available. FINDINGS: There is no evidence of fracture or dislocation. There is no evidence of arthropathy or other focal bone abnormality. Soft tissues are unremarkable. IMPRESSION: Negative. Electronically Signed   By: Signa Kell M.D.   On: 08/01/2023 09:27    Procedures Procedures    Medications Ordered in ED Medications - No data to display  ED Course/ Medical Decision Making/ A&P   37 year old here for evaluation of left shoulder pain after working out.  Worse with movement, improved with rest.  No midline neck pain to suggest radiculopathy.  No numbness or weakness.  No swelling or redness to upper extremity, chest pain, shortness of breath low suspicion for VTE, PE, occlusion.  His compartments are soft I low suspicion for compartment syndrome.  Pain is reproducible on exam, worse with overhead range of motion however negative Hawkins, empty can.  Will plan on labs, imaging and reassess  Labs and imaging personally viewed and interpreted:  EKG without ischemic changes X-ray without fracture, dislocation, joint effusion. CK2 50 BMP creatinine 0.84  Discussed results with patient.  I suspect likely overuse injury.  He has no deformity to suggest a biceps tendon injury.  Low suspicion for occult fracture, dislocation, gout, hemarthrosis, septic joint, VTE, compartment syndrome, bacterial infectious process.  DC home with symptomatic management, range of motion exercises, discussed no heavy lifting, follow-up with orthopedics, return for new or worsening symptoms.  The patient has been appropriately medically screened and/or stabilized in the ED. I have low suspicion for any other emergent medical condition which would require further screening, evaluation or  treatment in the ED or require inpatient management.  Patient is hemodynamically stable and in no acute distress.  Patient able to ambulate in department prior to ED.  Evaluation does not show acute pathology that would require ongoing or additional emergent interventions while in the emergency department or further inpatient treatment.  I have discussed the diagnosis with the patient and answered all questions.  Pain is been managed while in the emergency department and patient has no further complaints prior to discharge.  Patient is comfortable with plan discussed in room and is stable for discharge at this time.  I have discussed strict return precautions for returning to the emergency department.  Patient was encouraged to  follow-up with PCP/specialist refer to at discharge.                                  Medical Decision Making Amount and/or Complexity of Data Reviewed External Data Reviewed: labs, radiology, ECG and notes. Labs: ordered. Decision-making details documented in ED Course. Radiology: ordered and independent interpretation performed. Decision-making details documented in ED Course. ECG/medicine tests: ordered and independent interpretation performed. Decision-making details documented in ED Course.  Risk OTC drugs. Prescription drug management. Parenteral controlled substances. Decision regarding hospitalization. Diagnosis or treatment significantly limited by social determinants of health.          Final Clinical Impression(s) / ED Diagnoses Final diagnoses:  Acute pain of left shoulder    Rx / DC Orders ED Discharge Orders          Ordered    methocarbamol (ROBAXIN) 500 MG tablet  2 times daily        08/01/23 1008    naproxen (NAPROSYN) 500 MG tablet  2 times daily        08/01/23 1008    lidocaine (LIDODERM) 5 %  Every 24 hours        08/01/23 1008              Kenston Longton A, PA-C 08/01/23 1108    Long, Arlyss Repress, MD 08/02/23  [phone]

## 2023-08-01 NOTE — Discharge Instructions (Signed)
You labs and imaging were reasuring. You most likely have an overuse injury.  I would recommend not working out with the soulder until it tarts to feel better. I have written you for a few medications to help.  FOllow up with Orthopedics for re-evaluation next week.The numbner is listed on your discharge paperwork.  Call to scheudle and appointment.    Sus laboratorios e imgenes fueron tranquilizadores. Lo ms probable es que tengas una lesin por uso excesivo.  Recomendara no hacer ejercicio con el soulder hasta que empiece a sentirse mejor. 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  Haga un seguimiento con [employer] para una reevaluacin la prxima semana. El nmero figura en su documentacin de alta

## 2023-08-01 NOTE — ED Triage Notes (Addendum)
Pt arrived via POV, c/o left shoulder pain after working out the other day. Feels okay at rest, painful with mvmt. States pain is worsening over the last 4 days, has been taking ibuprofen and tylenol with no relief.
